# Patient Record
Sex: Female | Born: 1975 | Race: White | Hispanic: No | Smoking: Never smoker
Health system: Southern US, Community
[De-identification: ages and names within clinical notes are randomized; demographics above are authoritative.]

## PROBLEM LIST (undated history)

## (undated) DIAGNOSIS — F909 Attention-deficit hyperactivity disorder, unspecified type: Secondary | ICD-10-CM

## (undated) DIAGNOSIS — I4510 Unspecified right bundle-branch block: Secondary | ICD-10-CM

## (undated) DIAGNOSIS — E669 Obesity, unspecified: Secondary | ICD-10-CM

## (undated) DIAGNOSIS — F419 Anxiety disorder, unspecified: Secondary | ICD-10-CM

## (undated) DIAGNOSIS — B009 Herpesviral infection, unspecified: Secondary | ICD-10-CM

## (undated) DIAGNOSIS — F32A Depression, unspecified: Secondary | ICD-10-CM

## (undated) DIAGNOSIS — N200 Calculus of kidney: Secondary | ICD-10-CM

## (undated) DIAGNOSIS — I10 Essential (primary) hypertension: Secondary | ICD-10-CM

## (undated) DIAGNOSIS — R000 Tachycardia, unspecified: Secondary | ICD-10-CM

## (undated) DIAGNOSIS — R011 Cardiac murmur, unspecified: Secondary | ICD-10-CM

## (undated) DIAGNOSIS — R002 Palpitations: Secondary | ICD-10-CM

## (undated) DIAGNOSIS — F329 Major depressive disorder, single episode, unspecified: Secondary | ICD-10-CM

## (undated) DIAGNOSIS — R0602 Shortness of breath: Secondary | ICD-10-CM

## (undated) HISTORY — DX: Shortness of breath: R06.02

## (undated) HISTORY — DX: Major depressive disorder, single episode, unspecified: F32.9

## (undated) HISTORY — DX: Tachycardia, unspecified: R00.0

## (undated) HISTORY — DX: Obesity, unspecified: E66.9

## (undated) HISTORY — DX: Cardiac murmur, unspecified: R01.1

## (undated) HISTORY — DX: Unspecified right bundle-branch block: I45.10

## (undated) HISTORY — DX: Attention-deficit hyperactivity disorder, unspecified type: F90.9

## (undated) HISTORY — DX: Palpitations: R00.2

## (undated) HISTORY — PX: LAPAROSCOPIC CHOLECYSTECTOMY: SUR755

## (undated) HISTORY — DX: Depression, unspecified: F32.A

## (undated) HISTORY — DX: Essential (primary) hypertension: I10

## (undated) HISTORY — PX: MOUTH SURGERY: SHX715

## (undated) HISTORY — DX: Calculus of kidney: N20.0

## (undated) HISTORY — DX: Herpesviral infection, unspecified: B00.9

## (undated) HISTORY — DX: Anxiety disorder, unspecified: F41.9

---

## 1999-08-14 ENCOUNTER — Ambulatory Visit (HOSPITAL_COMMUNITY): Admission: RE | Admit: 1999-08-14 | Discharge: 1999-08-14 | Payer: Self-pay | Admitting: *Deleted

## 2000-01-08 ENCOUNTER — Other Ambulatory Visit: Admission: RE | Admit: 2000-01-08 | Discharge: 2000-01-08 | Payer: Self-pay | Admitting: Gynecology

## 2001-01-11 ENCOUNTER — Other Ambulatory Visit: Admission: RE | Admit: 2001-01-11 | Discharge: 2001-01-11 | Payer: Self-pay | Admitting: Gynecology

## 2001-01-21 HISTORY — PX: CERVICAL BIOPSY  W/ LOOP ELECTRODE EXCISION: SUR135

## 2001-02-10 ENCOUNTER — Other Ambulatory Visit: Admission: RE | Admit: 2001-02-10 | Discharge: 2001-02-10 | Payer: Self-pay | Admitting: Gynecology

## 2001-02-10 ENCOUNTER — Encounter (INDEPENDENT_AMBULATORY_CARE_PROVIDER_SITE_OTHER): Payer: Self-pay

## 2001-02-16 ENCOUNTER — Other Ambulatory Visit: Admission: RE | Admit: 2001-02-16 | Discharge: 2001-02-16 | Payer: Self-pay | Admitting: Gynecology

## 2001-02-16 ENCOUNTER — Encounter (INDEPENDENT_AMBULATORY_CARE_PROVIDER_SITE_OTHER): Payer: Self-pay

## 2001-06-02 ENCOUNTER — Other Ambulatory Visit: Admission: RE | Admit: 2001-06-02 | Discharge: 2001-06-02 | Payer: Self-pay | Admitting: Gynecology

## 2001-11-01 ENCOUNTER — Other Ambulatory Visit: Admission: RE | Admit: 2001-11-01 | Discharge: 2001-11-01 | Payer: Self-pay | Admitting: Gynecology

## 2002-01-30 ENCOUNTER — Encounter: Admission: RE | Admit: 2002-01-30 | Discharge: 2002-01-30 | Payer: Self-pay | Admitting: Gynecology

## 2002-04-16 ENCOUNTER — Inpatient Hospital Stay (HOSPITAL_COMMUNITY): Admission: AD | Admit: 2002-04-16 | Discharge: 2002-04-18 | Payer: Self-pay | Admitting: Gynecology

## 2002-04-16 ENCOUNTER — Encounter (INDEPENDENT_AMBULATORY_CARE_PROVIDER_SITE_OTHER): Payer: Self-pay | Admitting: Specialist

## 2002-06-04 ENCOUNTER — Other Ambulatory Visit: Admission: RE | Admit: 2002-06-04 | Discharge: 2002-06-04 | Payer: Self-pay | Admitting: Gynecology

## 2004-04-07 ENCOUNTER — Other Ambulatory Visit: Admission: RE | Admit: 2004-04-07 | Discharge: 2004-04-07 | Payer: Self-pay | Admitting: Gynecology

## 2004-04-14 ENCOUNTER — Emergency Department (HOSPITAL_COMMUNITY): Admission: EM | Admit: 2004-04-14 | Discharge: 2004-04-14 | Payer: Self-pay | Admitting: Emergency Medicine

## 2004-10-22 ENCOUNTER — Encounter (INDEPENDENT_AMBULATORY_CARE_PROVIDER_SITE_OTHER): Payer: Self-pay | Admitting: *Deleted

## 2004-10-22 ENCOUNTER — Inpatient Hospital Stay (HOSPITAL_COMMUNITY): Admission: AD | Admit: 2004-10-22 | Discharge: 2004-10-24 | Payer: Self-pay | Admitting: Gynecology

## 2004-12-03 ENCOUNTER — Other Ambulatory Visit: Admission: RE | Admit: 2004-12-03 | Discharge: 2004-12-03 | Payer: Self-pay | Admitting: Gynecology

## 2005-02-23 ENCOUNTER — Emergency Department (HOSPITAL_COMMUNITY): Admission: EM | Admit: 2005-02-23 | Discharge: 2005-02-23 | Payer: Self-pay | Admitting: Emergency Medicine

## 2006-08-31 ENCOUNTER — Other Ambulatory Visit: Admission: RE | Admit: 2006-08-31 | Discharge: 2006-08-31 | Payer: Self-pay | Admitting: Gynecology

## 2007-03-24 ENCOUNTER — Encounter: Admission: RE | Admit: 2007-03-24 | Discharge: 2007-03-24 | Payer: Self-pay | Admitting: Family Medicine

## 2007-04-01 ENCOUNTER — Emergency Department (HOSPITAL_COMMUNITY): Admission: EM | Admit: 2007-04-01 | Discharge: 2007-04-01 | Payer: Self-pay | Admitting: Emergency Medicine

## 2007-04-14 ENCOUNTER — Encounter: Admission: RE | Admit: 2007-04-14 | Discharge: 2007-04-14 | Payer: Self-pay | Admitting: Family Medicine

## 2007-05-26 ENCOUNTER — Ambulatory Visit (HOSPITAL_COMMUNITY): Admission: RE | Admit: 2007-05-26 | Discharge: 2007-05-27 | Payer: Self-pay | Admitting: *Deleted

## 2007-05-26 ENCOUNTER — Encounter (INDEPENDENT_AMBULATORY_CARE_PROVIDER_SITE_OTHER): Payer: Self-pay | Admitting: *Deleted

## 2007-09-06 ENCOUNTER — Other Ambulatory Visit: Admission: RE | Admit: 2007-09-06 | Discharge: 2007-09-06 | Payer: Self-pay | Admitting: Gynecology

## 2008-05-22 ENCOUNTER — Ambulatory Visit: Payer: Self-pay | Admitting: Gynecology

## 2008-07-17 ENCOUNTER — Encounter: Admission: RE | Admit: 2008-07-17 | Discharge: 2008-08-21 | Payer: Self-pay | Admitting: Family Medicine

## 2008-08-22 ENCOUNTER — Ambulatory Visit: Payer: Self-pay | Admitting: Gynecology

## 2008-08-26 ENCOUNTER — Encounter: Admission: RE | Admit: 2008-08-26 | Discharge: 2008-08-27 | Payer: Self-pay | Admitting: Family Medicine

## 2008-08-30 ENCOUNTER — Encounter: Admission: RE | Admit: 2008-08-30 | Discharge: 2008-08-30 | Payer: Self-pay | Admitting: Gynecology

## 2008-09-09 ENCOUNTER — Encounter: Payer: Self-pay | Admitting: Gynecology

## 2008-09-09 ENCOUNTER — Other Ambulatory Visit: Admission: RE | Admit: 2008-09-09 | Discharge: 2008-09-09 | Payer: Self-pay | Admitting: Gynecology

## 2008-09-09 ENCOUNTER — Ambulatory Visit: Payer: Self-pay | Admitting: Gynecology

## 2009-09-10 ENCOUNTER — Ambulatory Visit: Payer: Self-pay | Admitting: Gynecology

## 2009-09-10 ENCOUNTER — Other Ambulatory Visit: Admission: RE | Admit: 2009-09-10 | Discharge: 2009-09-10 | Payer: Self-pay | Admitting: Gynecology

## 2009-11-24 ENCOUNTER — Ambulatory Visit: Payer: Self-pay | Admitting: Gynecology

## 2010-09-21 ENCOUNTER — Other Ambulatory Visit: Payer: Self-pay | Admitting: Gynecology

## 2010-09-21 ENCOUNTER — Ambulatory Visit
Admission: RE | Admit: 2010-09-21 | Discharge: 2010-09-21 | Payer: Self-pay | Source: Home / Self Care | Attending: Gynecology | Admitting: Gynecology

## 2010-09-21 ENCOUNTER — Other Ambulatory Visit (HOSPITAL_COMMUNITY)
Admission: RE | Admit: 2010-09-21 | Discharge: 2010-09-21 | Disposition: A | Discharge status: Home / Self Care | Payer: Medicaid Other | Source: Ambulatory Visit | Attending: Gynecology | Admitting: Gynecology

## 2010-09-21 DIAGNOSIS — Z124 Encounter for screening for malignant neoplasm of cervix: Secondary | ICD-10-CM | POA: Insufficient documentation

## 2010-10-08 ENCOUNTER — Other Ambulatory Visit (INDEPENDENT_AMBULATORY_CARE_PROVIDER_SITE_OTHER): Payer: Medicaid Other

## 2010-10-08 DIAGNOSIS — R635 Abnormal weight gain: Secondary | ICD-10-CM

## 2010-10-08 DIAGNOSIS — R82998 Other abnormal findings in urine: Secondary | ICD-10-CM

## 2010-10-08 DIAGNOSIS — Z833 Family history of diabetes mellitus: Secondary | ICD-10-CM

## 2011-01-05 NOTE — Op Note (Signed)
NAMEANTIA, Megan Pace              ACCOUNT NO.:  [id number]   MEDICAL RECORD NO.:  [id number]          PATIENT TYPE:  OIB   LOCATION:  5727                         FACILITY:  [facility]   PHYSICIAN:  Alfonse Ras, MD   DATE OF BIRTH:  December 06, 1975   DATE OF PROCEDURE:  05/26/2007  DATE OF DISCHARGE:                               OPERATIVE REPORT   PREOPERATIVE DIAGNOSIS:  Biliary dyskinesia.   POSTOPERATIVE DIAGNOSIS:  Biliary dyskinesia.   FINDINGS:  Normal cholangiogram.   PROCEDURE:  Laparoscopic cholecystectomy with intraoperative  cholangiogram.   SURGEON:  Alfonse Ras, M.D.   ASSISTANT:  Currie Paris, M.D.   ANESTHESIA:  General endotracheal tube.   DESCRIPTION OF PROCEDURE:  The patient was taken to the operating room  and placed in a supine position.  After adequate general anesthesia was  induced using endotracheal tube, the abdomen was prepped and draped in  the normal sterile fashion.  Using a transverse infraumbilical incision,  I dissected down to the fascia.  Fascia was opened vertically.  An 0  Vicryl pursestring suture was placed around the fascial defect and a  Hasson trocar was placed in the abdomen.  Pneumoperitoneum was obtained.   Under direct vision, 11 mm trocar was placed in the subxiphoid position  and two 5-mm trocars were placed in the right abdomen.  The gallbladder  was identified and retracted cephalad.  There were few small filmy  adhesions, which were taken down with blunt dissection without  difficulty.  The neck of the gallbladder was identified and the cystic  duct with a critical view was dissected.  It was clamped with a clip  proximally and a small ductotomy was made.  Reddick catheter was placed  down through the cystic duct and cholangiogram was performed.  This  showed free flow into the duodenum, normal common bile duct, and normal  hepatic ducts.  The catheter was then removed and the cystic duct  distally was triply  clipped and divided.  Cystic artery was identified  in a similar fashion, dissected, triply clipped, and divided.  Gallbladder was taken off the gallbladder bed using Bovie electrocautery  and removed through the umbilical port without difficulty.  The right  upper quadrant was copiously irrigated.  Adequate hemostasis was  ensured.  Pneumoperitoneum was released.  The fascial defect was closed  with the 0 Vicryl  pursestring suture.  Skin incisions were closed with subcuticular 4-0  Monocryl.  All incisions were injected using 0.5 Marcaine.  Steri-Strips  and sterile dressings were applied.  The patient tolerated the procedure  well, went to PACU in good condition.      Alfonse Ras, MD  Electronically Signed     KRE/MEDQ  D:  05/26/2007  T:  05/26/2007  Job:  [id number]

## 2011-01-08 NOTE — Discharge Summary (Signed)
   NAME:  Megan Pace, Megan Pace                        ACCOUNT NO.:  [id number]   MEDICAL RECORD NO.:  [id number]                   PATIENT TYPE:  INP   LOCATION:  9135                                 FACILITY:  [facility]   PHYSICIAN:  Juan H. Lily Peer, M.D.             DATE OF BIRTH:  1975/12/18   DATE OF ADMISSION:  04/16/2002  DATE OF DISCHARGE:  04/18/2002                                 DISCHARGE SUMMARY   DISCHARGE DIAGNOSES:  1. Intrauterine pregnancy 36-1/2 weeks.  2. Spontaneous onset of labor.   PROCEDURE:  Vacuum assisted vaginal delivery of viable infant over midline  episiotomy with third degree extension.   HISTORY OF PRESENT ILLNESS:  The patient is a 35 year old gravida 2, para 0-  0-1-0, LMP July 24, 2001, [facility] May 10, 2002 per ultrasound.  Prenatal course was complicated by gestational diabetes which was diet  controlled.   LABORATORIES:  Blood type O+.  Antibody screen negative.  RPR, HBSAG, HIV  nonreactive.   HOSPITAL COURSE:  The patient presented at 36-1/2 weeks with spontaneous  onset of labor.  She did progress to complete dilatation.  Delivery was  accomplished per vacuum assistance viable female infant, Apgars 8/9, weight  5 pounds 13 ounces.  Postpartum course patient remained afebrile.  Had no  difficulty voiding.  Was able to be discharged in satisfactory condition on  her second postpartum day.  CBC:  Hematocrit 33.1, hemoglobin 11.4, WBC  14.9, platelets 263,000.   DISPOSITION:  Follow up in six weeks.  Continue with prenatal vitamins and  iron.  Motrin for pain.     Elwyn Lade . Hancock, N.P.                Gaetano Hawthorne. Lily Peer, M.D.    MKH/MEDQ  D:  05/21/2002  T:  05/21/2002  Job:  [id number]

## 2011-01-08 NOTE — H&P (Signed)
Megan Pace, Megan Pace              ACCOUNT NO.:  [id number]   MEDICAL RECORD NO.:  [id number]          PATIENT TYPE:  MAT   LOCATION:  MATC                          FACILITY:  [facility]   PHYSICIAN:  Juan H. Lily Peer, M.D.DATE OF BIRTH:  1975-10-07   DATE OF ADMISSION:  10/22/2004  DATE OF DISCHARGE:                                HISTORY & PHYSICAL   CHIEF COMPLAINT:  Term intrauterine pregnancy, favorable cervix.   HISTORY:  The patient is a 35 year old gravida 3, para 1, with corrected  estimated date of confinement October 23, 2004.  Will be 39-6/7ths weeks'  gestation when she is admitted at [facility] on the morning of October 22, 2004.  The patient was seen in the office on October 19, 2004 and  respectively on October 21, 2004.  Her cervix was 2-cm, 80% effaced, -2  station, and is being admitted for Pitocin IV induction.  Her last child had  weighed 6 pounds and delivered at 37 weeks with a nine hour labor.  The  patient's prenatal course, she had Trichomoniasis and BV and was treated.  She has had a history of LEEP, cervical conization back in 2002.  She had  gestational diabetes in her last pregnancy but passed a diabetes screen with  this pregnancy.   PAST MEDICAL HISTORY:  1.  The patient denies any allergies.  2.  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  Margins had been free.  3.  She has had one normal spontaneous vaginal delivery.   She had been taking Prozac in the past for anxiety and depression, had not  been taking it during the pregnancy.   Mother and grandfather with history of diabetes.   REVIEW OF SYSTEMS:  See hospital form.   PHYSICAL EXAMINATION:  VITAL SIGNS:  Blood pressure today 118/76, urine  trace protein negative glucose, weight 175.5 pounds, fetal heart rate 150  beats per minute.  HEENT:  Unremarkable.  NECK:  Supple.  Trachea midline.  No carotid bruits.  No thyromegaly.  LUNGS:  Clear to auscultation without rhonchi or wheezes.  HEART:   Regular rate and rhythm.  No murmurs or gallops.  BREAST:  Not done.  ABDOMEN:  Gravid uterus and fundal height 38-cm.  Vertex presentation by  [facility] maneuver.  PELVIC:  Cervix 1 - to -2-cm, 80% effaced, -2 station.  EXTREMITIES:  DTRs 1+.  Trace edema.   PRENATAL LABS:  The patient with O positive blood type.  Negative antibody  screen.  VDRL was nonreactive.  Rubella immune.  Hepatitis B surface antigen  and HIV were negative.  Diabetes screen was normal.  Her GBS culture was  negative and her Pap smear was normal.   ASSESSMENT:  1.  A 35 year old gravida 3, para 1, abortion 1, at 39-6/7ths weeks'      gestation with advanced cervical dilatation.  2.  Group B strep negative.   The patient will be admitted on the morning of October 22, 2004 for a Pitocin  IV.  Her cervix was 2-cm, 80% effaced, -2 station, vertex presentation on  office visit  on October 21, 2004.  Her GBS culture was negative.  Induction was  discussed with the patient, all questions were answered, and will follow  accordingly.   PLAN:  As per assessment above.      JHF/MEDQ  D:  10/21/2004  T:  10/21/2004  Job:  [id number]

## 2011-03-30 ENCOUNTER — Other Ambulatory Visit: Payer: Self-pay | Admitting: *Deleted

## 2011-03-30 MED ORDER — ALPRAZOLAM 0.5 MG PO TABS: 0.5000 mg | ORAL_TABLET | ORAL | Status: AC

## 2011-03-30 NOTE — Telephone Encounter (Signed)
PHARMACY FAXED OVER RX REFILL.  IF REFILLS PLEASE FILL OUT AND ROUTE BACK. I WILL CALL RX IN.

## 2011-03-31 NOTE — Telephone Encounter (Signed)
RX FAXED TO [facility] XANAX 0.5MG  TAB #30 6 REFILLS

## 2011-06-03 LAB — DIFFERENTIAL
Basophils Absolute: 0
Eosinophils Absolute: 0.2
Eosinophils Relative: 3
Monocytes Absolute: 0.5

## 2011-06-03 LAB — CBC
HCT: 38.9
MCHC: 33.9
RBC: 4.55

## 2011-06-07 LAB — COMPREHENSIVE METABOLIC PANEL
Alkaline Phosphatase: 77
CO2: 27
Calcium: 9.1
Chloride: 106
GFR calc Af Amer: >60
GFR calc non Af Amer: >60
Glucose, Bld: 123 — ABNORMAL HIGH

## 2011-06-07 LAB — DIFFERENTIAL
Basophils Absolute: 0
Eosinophils Relative: 7 — ABNORMAL HIGH
Lymphocytes Relative: 23
Monocytes Absolute: 0.6
Monocytes Relative: 6
Neutrophils Relative %: 65

## 2011-06-07 LAB — URINALYSIS, ROUTINE W REFLEX MICROSCOPIC
Bilirubin Urine: NEGATIVE
Glucose, UA: NEGATIVE
Hgb urine dipstick: NEGATIVE
Protein, ur: NEGATIVE
Specific Gravity, Urine: 1.038 — ABNORMAL HIGH

## 2011-06-07 LAB — CBC
MCHC: 34.5
Platelets: 364
RBC: 4.78
RDW: 13.5

## 2011-06-07 LAB — LIPASE, BLOOD: Lipase: 16

## 2011-06-07 LAB — POCT PREGNANCY, URINE
Operator id: 192351
Preg Test, Ur: NEGATIVE

## 2011-10-06 ENCOUNTER — Ambulatory Visit (INDEPENDENT_AMBULATORY_CARE_PROVIDER_SITE_OTHER): Payer: Medicaid Other | Admitting: Gynecology

## 2011-10-06 ENCOUNTER — Encounter: Payer: Medicaid Other | Admitting: Gynecology

## 2011-10-06 ENCOUNTER — Encounter: Payer: Self-pay | Admitting: Gynecology

## 2011-10-06 ENCOUNTER — Other Ambulatory Visit (HOSPITAL_COMMUNITY)
Admission: RE | Admit: 2011-10-06 | Discharge: 2011-10-06 | Disposition: A | Discharge status: Home / Self Care | Payer: Medicaid Other | Source: Ambulatory Visit | Attending: Gynecology | Admitting: Gynecology

## 2011-10-06 VITALS — BP 124/78 | Ht 62.75 in | Wt 179.0 lb

## 2011-10-06 DIAGNOSIS — Z0000 Encounter for general adult medical examination without abnormal findings: Secondary | ICD-10-CM

## 2011-10-06 DIAGNOSIS — Z833 Family history of diabetes mellitus: Secondary | ICD-10-CM | POA: Insufficient documentation

## 2011-10-06 DIAGNOSIS — Z01419 Encounter for gynecological examination (general) (routine) without abnormal findings: Secondary | ICD-10-CM

## 2011-10-06 DIAGNOSIS — Z8632 Personal history of gestational diabetes: Secondary | ICD-10-CM

## 2011-10-06 DIAGNOSIS — R634 Abnormal weight loss: Secondary | ICD-10-CM

## 2011-10-06 LAB — URINALYSIS W MICROSCOPIC + REFLEX CULTURE
Bilirubin Urine: NEGATIVE
Ketones, ur: NEGATIVE mg/dL
Leukocytes, UA: NEGATIVE
Nitrite: NEGATIVE
Urobilinogen, UA: 0.2 mg/dL (ref 0.0–1.0)
pH: 6.5 (ref 5.0–8.0)

## 2011-10-06 NOTE — Progress Notes (Signed)
Megan Pace 04/24/1976 [id number]   History:    36 y.o.  for annual exam with concern about her IUD string been filled by her partner. Review of patient's records indicated that she had a LEEP cervical conization in 2002 and margins were clear. Subsequent Pap smears have been normal. She had a Mirena IUD placed in April 2011 and has done well otherwise. Patient has a history of herpes but no recent outbreaks reported. For depression and anxiety Dr. Kizzie Bane has been following her period patient with strong family history diabetes as well as herself having had gestational diabetes in the past.  Past medical history,surgical history, family history and social history were all reviewed and documented in the EPIC chart.  Gynecologic History No LMP recorded. Patient is not currently having periods (Reason: IUD). Contraception: IUD Last Pap: January 2012. Results were: normal Last mammogram: H. 35 baseline. Results were: normal  Obstetric History OB History    Grav Para Term Preterm Abortions TAB SAB Ect Mult Living   4 2 2  1  1   2      # Outc Date GA Lbr Len/2nd Wgt Sex Del Anes PTL Lv   1 TRM     F SVD  No Yes   2 TRM     F SVD  No Yes   3 GRA            4 SAB                ROS:  Was performed and pertinent positives and negatives are included in the history.  Exam: chaperone present  BP 124/78  Ht 5' 2.75" (1.594 m)  Wt 179 lb (81.194 kg)  BMI 31.96 kg/m2  Body mass index is 31.96 kg/(m^2).  General appearance : Well developed well nourished female. No acute distress HEENT: Neck supple, trachea midline, no carotid bruits, no thyroidmegaly Lungs: Clear to auscultation, no rhonchi or wheezes, or rib retractions  Heart: Regular rate and rhythm, no murmurs or gallops Breast:Examined in sitting and supine position were symmetrical in appearance, no palpable masses or tenderness,  no skin retraction, no nipple inversion, no nipple discharge, no skin discoloration, no axillary or  supraclavicular lymphadenopathy Abdomen: no palpable masses or tenderness, no rebound or guarding Extremities: no edema or skin discoloration or tenderness  Pelvic:  Bartholin, Urethra, Skene Glands: Within normal limits             Vagina: No gross lesions or discharge  Cervix: No gross lesions or discharge, IUD string not visualized  Uterus  anteverted, normal size, shape and consistency, non-tender and mobile  Adnexa  Without masses or tenderness  Anus and perineum  normal   Rectovaginal  normal sphincter tone without palpated masses or tenderness             Hemoccult not done     Assessment/Plan:  36 y.o. female for annual exam will return back next week for an ultrasound to confirm that the IUD still in place. The cervix was slightly friable which was treated with silver nitrate. There was no evidence of any vaginal discharge. Patient been exercising indicting to try to lose weight. Review of her record again she was weighing 185 last years down to 179. Literature information on exercise and diet provided. 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 Pap smear was done today if negative we'll proceed with a Pap smear every 3 years. The following labs were drawn  CBC, cholesterol, TSH, random blood sugar, and urinalysis.    Ok Edwards MD, 4:30 PM 10/06/2011

## 2011-10-06 NOTE — Patient Instructions (Signed)
Exercise to Lose Weight Exercise and a healthy diet may help you lose weight. Your doctor may suggest specific exercises. EXERCISE IDEAS AND TIPS  Choose low-cost things you enjoy doing, such as walking, bicycling, or exercising to workout videos.   Take stairs instead of the elevator.   Walk during your lunch break.   Park your car further away from work or school.   Go to a gym or an exercise class.   Start with 5 to 10 minutes of exercise each day. Build up to 30 minutes of exercise 4 to 6 days a week.   Wear shoes with good support and comfortable clothes.   Stretch before and after working out.   Work out until you breathe harder and your heart beats faster.   Drink extra water when you exercise.   Do not do so much that you hurt yourself, feel dizzy, or get very short of breath.  Exercises that burn about 150 calories:  Running 1  miles in 15 minutes.   Playing volleyball for 45 to 60 minutes.   Washing and waxing a car for 45 to 60 minutes.   Playing touch football for 45 minutes.   Walking 1  miles in 35 minutes.   Pushing a stroller 1  miles in 30 minutes.   Playing basketball for 30 minutes.   Raking leaves for 30 minutes.   Bicycling 5 miles in 30 minutes.   Walking 2 miles in 30 minutes.   Dancing for 30 minutes.   Shoveling snow for 15 minutes.   Swimming laps for 20 minutes.   Walking up stairs for 15 minutes.   Bicycling 4 miles in 15 minutes.   Gardening for 30 to 45 minutes.   Jumping rope for 15 minutes.   Washing windows or floors for 45 to 60 minutes.  Document Released: 09/11/2010 Document Revised: 04/21/2011 Document Reviewed: 09/11/2010 [employer] Patient Information 2012 [employer].                                                  Cholesterol Control Diet  Cholesterol levels in your body are determined significantly by your diet. Cholesterol levels may also be related to heart disease. The following material helps to  explain this relationship and discusses what you can do to help keep your heart healthy. Not all cholesterol is bad. Low-density lipoprotein (LDL) cholesterol is the "bad" cholesterol. It may cause fatty deposits to build up inside your arteries. High-density lipoprotein (HDL) cholesterol is "good." It helps to remove the "bad" LDL cholesterol from your blood. Cholesterol is a very important risk factor for heart disease. Other risk factors are high blood pressure, smoking, stress, heredity, and weight. The heart muscle gets its supply of blood through the coronary arteries. If your LDL cholesterol is high and your HDL cholesterol is low, you are at risk for having fatty deposits build up in your coronary arteries. This leaves less room through which blood can flow. Without sufficient blood and oxygen, the heart muscle cannot function properly and you may feel chest pains (angina pectoris). When a coronary artery closes up entirely, a part of the heart muscle may die, causing a heart attack (myocardial infarction). CHECKING CHOLESTEROL When your caregiver sends your blood to a lab to be analyzed for cholesterol, a complete lipid (fat) profile may be done. With   this test, the total amount of cholesterol and levels of LDL and HDL are determined. Triglycerides are a type of fat that circulates in the blood and can also be used to determine heart disease risk. The list below describes what the numbers should be: Test: Total Cholesterol.  Less than 200 mg/dl.  Test: LDL "bad cholesterol."  Less than 100 mg/dl.   Less than 70 mg/dl if you are at very high risk of a heart attack or sudden cardiac death.  Test: HDL "good cholesterol."  Greater than 50 mg/dl for women.   Greater than 40 mg/dl for men.  Test: Triglycerides.  Less than 150 mg/dl.  CONTROLLING CHOLESTEROL WITH DIET Although exercise and lifestyle factors are important, your diet is key. That is because certain foods are known to raise  cholesterol and others to lower it. The goal is to balance foods for their effect on cholesterol and more importantly, to replace saturated and trans fat with other types of fat, such as monounsaturated fat, polyunsaturated fat, and omega-3 fatty acids. On average, a person should consume no more than 15 to 17 g of saturated fat daily. Saturated and trans fats are considered "bad" fats, and they will raise LDL cholesterol. Saturated fats are primarily found in animal products such as meats, butter, and cream. However, that does not mean you need to sacrifice all your favorite foods. Today, there are good tasting, low-fat, low-cholesterol substitutes for most of the things you like to eat. Choose low-fat or nonfat alternatives. Choose round or loin cuts of red meat, since these types of cuts are lowest in fat and cholesterol. Chicken (without the skin), fish, veal, and ground turkey breast are excellent choices. Eliminate fatty meats, such as hot dogs and salami. Even shellfish have little or no saturated fat. Have a 3 oz (85 g) portion when you eat lean meat, poultry, or fish. Trans fats are also called "partially hydrogenated oils." They are oils that have been scientifically manipulated so that they are solid at room temperature resulting in a longer shelf life and improved taste and texture of foods in which they are added. Trans fats are found in stick margarine, some tub margarines, cookies, crackers, and baked goods.  When baking and cooking, oils are an excellent substitute for butter. The monounsaturated oils are especially beneficial since it is believed they lower LDL and raise HDL. The oils you should avoid entirely are saturated tropical oils, such as coconut and palm.  Remember to eat liberally from food groups that are naturally free of saturated and trans fat, including fish, fruit, vegetables, beans, grains (barley, rice, couscous, bulgur wheat), and pasta (without cream sauces).  IDENTIFYING  FOODS THAT LOWER CHOLESTEROL  Soluble fiber may lower your cholesterol. This type of fiber is found in fruits such as apples, vegetables such as broccoli, potatoes, and carrots, legumes such as beans, peas, and lentils, and grains such as barley. Foods fortified with plant sterols (phytosterol) may also lower cholesterol. You should eat at least 2 g per day of these foods for a cholesterol lowering effect.  Read package labels to identify low-saturated fats, trans fats free, and low-fat foods at the supermarket. Select cheeses that have only 2 to 3 g saturated fat per ounce. Use a heart-healthy tub margarine that is free of trans fats or partially hydrogenated oil. When buying baked goods (cookies, crackers), avoid partially hydrogenated oils. Breads and muffins should be made from whole grains (whole-wheat or whole oat flour, instead of "flour" or "  enriched flour"). Buy non-creamy canned soups with reduced salt and no added fats.  FOOD PREPARATION TECHNIQUES  Never deep-fry. If you must fry, either stir-fry, which uses very little fat, or use non-stick cooking sprays. When possible, broil, bake, or roast meats, and steam vegetables. Instead of dressing vegetables with butter or margarine, use lemon and herbs, applesauce and cinnamon (for squash and sweet potatoes), nonfat yogurt, salsa, and low-fat dressings for salads.  LOW-SATURATED FAT / LOW-FAT FOOD SUBSTITUTES Meats / Saturated Fat (g)  Avoid: Steak, marbled (3 oz/85 g) / 11 g   Choose: Steak, lean (3 oz/85 g) / 4 g   Avoid: Hamburger (3 oz/85 g) / 7 g   Choose: Hamburger, lean (3 oz/85 g) / 5 g   Avoid: Ham (3 oz/85 g) / 6 g   Choose: Ham, lean cut (3 oz/85 g) / 2.4 g   Avoid: Chicken, with skin, dark meat (3 oz/85 g) / 4 g   Choose: Chicken, skin removed, dark meat (3 oz/85 g) / 2 g   Avoid: Chicken, with skin, light meat (3 oz/85 g) / 2.5 g   Choose: Chicken, skin removed, light meat (3 oz/85 g) / 1 g  Dairy / Saturated Fat  (g)  Avoid: Whole milk (1 cup) / 5 g   Choose: Low-fat milk, 2% (1 cup) / 3 g   Choose: Low-fat milk, 1% (1 cup) / 1.5 g   Choose: Skim milk (1 cup) / 0.3 g   Avoid: Hard cheese (1 oz/28 g) / 6 g   Choose: Skim milk cheese (1 oz/28 g) / 2 to 3 g   Avoid: Cottage cheese, 4% fat (1 cup) / 6.5 g   Choose: Low-fat cottage cheese, 1% fat (1 cup) / 1.5 g   Avoid: Ice cream (1 cup) / 9 g   Choose: Sherbet (1 cup) / 2.5 g   Choose: Nonfat frozen yogurt (1 cup) / 0.3 g   Choose: Frozen fruit bar / trace   Avoid: Whipped cream (1 tbs) / 3.5 g   Choose: Nondairy whipped topping (1 tbs) / 1 g  Condiments / Saturated Fat (g)  Avoid: Mayonnaise (1 tbs) / 2 g   Choose: Low-fat mayonnaise (1 tbs) / 1 g   Avoid: Butter (1 tbs) / 7 g   Choose: Extra light margarine (1 tbs) / 1 g   Avoid: Coconut oil (1 tbs) / 11.8 g   Choose: Olive oil (1 tbs) / 1.8 g   Choose: Corn oil (1 tbs) / 1.7 g   Choose: Safflower oil (1 tbs) / 1.2 g   Choose: Sunflower oil (1 tbs) / 1.4 g   Choose: Soybean oil (1 tbs) / 2.4 g   Choose: Canola oil (1 tbs) / 1 g  Document Released: 08/09/2005 Document Revised: 04/21/2011 Document Reviewed: 01/28/2011 [employer] Patient Information 2012 [employer].   

## 2011-10-06 NOTE — Progress Notes (Signed)
Addended by: Bertram Savin A on: 10/06/2011 04:59 PM   Modules accepted: Orders

## 2011-10-07 LAB — CBC WITH DIFFERENTIAL/PLATELET
Basophils Relative: 0 % (ref 0–1)
Eosinophils Absolute: 0.1 10*3/uL (ref 0.0–0.7)
Eosinophils Relative: 1 % (ref 0–5)
Hemoglobin: 13.9 g/dL (ref 12.0–15.0)
MCH: 28.8 pg (ref 26.0–34.0)
Monocytes Relative: 7 % (ref 3–12)
Neutro Abs: 6.9 10*3/uL (ref 1.7–7.7)
Neutrophils Relative %: 59 % (ref 43–77)
Platelets: 402 10*3/uL — ABNORMAL HIGH (ref 150–400)
WBC: 11.7 10*3/uL — ABNORMAL HIGH (ref 4.0–10.5)

## 2011-10-07 LAB — TSH: TSH: 1.434 u[IU]/mL (ref 0.350–4.500)

## 2011-10-07 LAB — GLUCOSE, RANDOM: Glucose, Bld: 119 mg/dL — ABNORMAL HIGH (ref 70–99)

## 2011-10-07 NOTE — Progress Notes (Signed)
Error entry

## 2011-10-08 ENCOUNTER — Other Ambulatory Visit: Payer: Self-pay | Admitting: *Deleted

## 2011-10-11 ENCOUNTER — Ambulatory Visit (INDEPENDENT_AMBULATORY_CARE_PROVIDER_SITE_OTHER): Payer: Medicaid Other | Admitting: Gynecology

## 2011-10-11 ENCOUNTER — Encounter: Payer: Self-pay | Admitting: Gynecology

## 2011-10-11 ENCOUNTER — Ambulatory Visit (INDEPENDENT_AMBULATORY_CARE_PROVIDER_SITE_OTHER): Payer: Medicaid Other

## 2011-10-11 VITALS — BP 126/84

## 2011-10-11 DIAGNOSIS — N930 Postcoital and contact bleeding: Secondary | ICD-10-CM

## 2011-10-11 DIAGNOSIS — N831 Corpus luteum cyst of ovary, unspecified side: Secondary | ICD-10-CM

## 2011-10-11 DIAGNOSIS — N83209 Unspecified ovarian cyst, unspecified side: Secondary | ICD-10-CM

## 2011-10-11 NOTE — Patient Instructions (Signed)
Follow up ultrasound three months

## 2011-10-11 NOTE — Progress Notes (Signed)
Patient presented to the office today for an ultrasound due to the fact that time of her annual exam on favor the 13th the IUD string was not seen. Ultrasound today as follows:  Uterus measures 7.9 x 4.5 x 3.3 cm endometrial stripe 6 mm. IUD was seen in normal position. Left ovary was normal. Right ovary thickwalled cyst with reticular echo pattern measuring 30 x 23 x 29 mm average size 27 mm with positive color flow in the periphery. Arterial blood flow seen in the right ovary. No fluid in the cul-de-sac.  Recently an office visit her hemoglobin was found to be 11.7 she been instructed to start taking iron 1 tablet daily. Also a random blood sugar was 119 and patient was to return later in the week for fasting blood sugar. Her recent Pap smear was normal as well.  Patient to return back in 3 months for followup ultrasound. Incidental finding of this small cyst on the right ovary appears to be a hemorrhagic as/corpus luteum cyst. Patient is asymptomatic otherwise.

## 2011-10-13 ENCOUNTER — Other Ambulatory Visit: Payer: Medicaid Other

## 2012-03-01 ENCOUNTER — Encounter: Payer: Self-pay | Admitting: Gynecology

## 2012-03-01 ENCOUNTER — Ambulatory Visit (INDEPENDENT_AMBULATORY_CARE_PROVIDER_SITE_OTHER): Payer: Medicaid Other

## 2012-03-01 ENCOUNTER — Ambulatory Visit (INDEPENDENT_AMBULATORY_CARE_PROVIDER_SITE_OTHER): Payer: Medicaid Other | Admitting: Gynecology

## 2012-03-01 VITALS — BP 122/70

## 2012-03-01 DIAGNOSIS — IMO0001 Reserved for inherently not codable concepts without codable children: Secondary | ICD-10-CM

## 2012-03-01 DIAGNOSIS — N831 Corpus luteum cyst of ovary, unspecified side: Secondary | ICD-10-CM

## 2012-03-01 DIAGNOSIS — Z975 Presence of (intrauterine) contraceptive device: Secondary | ICD-10-CM

## 2012-03-01 DIAGNOSIS — N83209 Unspecified ovarian cyst, unspecified side: Secondary | ICD-10-CM

## 2012-03-01 NOTE — Progress Notes (Signed)
Patient presented to the office today for an ultrasound followup ovarian cyst. Patient had been seen the office in February 2013 for her annual exam the IUD string was not seen. An ultrasound was done which demonstrated the following:  Uterus measures 7.9 x 4.5 x 3.3 cm endometrial stripe 6 mm. IUD was seen in normal position. Left ovary was normal. Right ovary thickwalled cyst with reticular echo pattern measuring 30 x 23 x 29 mm average size 27 mm with positive color flow in the periphery. Arterial blood flow seen in the right ovary. No fluid in the cul-de-sac.  She returned today for followup ultrasound she is otherwise asymptomatic. The ultrasound demonstrated uterus measures 7.5 x 5.2 x 2.8 cm with endometrial stripe of 6.2 mm. Uterus is anteverted and IUD was seen in the normal position. A right corpus luteum cyst was noted on the right measuring 22 x 17 x 15 mm with arterial blood flow in the periphery left R. was normal cul-de-sac was negative.  Patient was reassured the findings discussed she's otherwise scheduled to return back next year or when necessary.

## 2012-07-07 ENCOUNTER — Encounter (HOSPITAL_BASED_OUTPATIENT_CLINIC_OR_DEPARTMENT_OTHER): Payer: Self-pay

## 2012-07-07 ENCOUNTER — Emergency Department (HOSPITAL_BASED_OUTPATIENT_CLINIC_OR_DEPARTMENT_OTHER): Payer: Medicaid Other

## 2012-07-07 ENCOUNTER — Emergency Department (HOSPITAL_BASED_OUTPATIENT_CLINIC_OR_DEPARTMENT_OTHER)
Admission: EM | Admit: 2012-07-07 | Discharge: 2012-07-07 | Disposition: A | Discharge status: Home / Self Care | Payer: Medicaid Other | Attending: Emergency Medicine | Admitting: Emergency Medicine

## 2012-07-07 DIAGNOSIS — Z79899 Other long term (current) drug therapy: Secondary | ICD-10-CM | POA: Insufficient documentation

## 2012-07-07 DIAGNOSIS — F329 Major depressive disorder, single episode, unspecified: Secondary | ICD-10-CM | POA: Insufficient documentation

## 2012-07-07 DIAGNOSIS — R002 Palpitations: Secondary | ICD-10-CM | POA: Insufficient documentation

## 2012-07-07 DIAGNOSIS — F411 Generalized anxiety disorder: Secondary | ICD-10-CM | POA: Insufficient documentation

## 2012-07-07 DIAGNOSIS — B009 Herpesviral infection, unspecified: Secondary | ICD-10-CM | POA: Insufficient documentation

## 2012-07-07 DIAGNOSIS — F3289 Other specified depressive episodes: Secondary | ICD-10-CM | POA: Insufficient documentation

## 2012-07-07 DIAGNOSIS — I10 Essential (primary) hypertension: Secondary | ICD-10-CM | POA: Insufficient documentation

## 2012-07-07 DIAGNOSIS — E119 Type 2 diabetes mellitus without complications: Secondary | ICD-10-CM | POA: Insufficient documentation

## 2012-07-07 DIAGNOSIS — R200 Anesthesia of skin: Secondary | ICD-10-CM

## 2012-07-07 DIAGNOSIS — F909 Attention-deficit hyperactivity disorder, unspecified type: Secondary | ICD-10-CM | POA: Insufficient documentation

## 2012-07-07 DIAGNOSIS — R209 Unspecified disturbances of skin sensation: Secondary | ICD-10-CM | POA: Insufficient documentation

## 2012-07-07 LAB — TROPONIN I: Troponin I: <0.3 ng/mL (ref <0.30)

## 2012-07-07 LAB — CBC WITH DIFFERENTIAL/PLATELET
Basophils Absolute: 0 10*3/uL (ref 0.0–0.1)
Basophils Relative: 0 % (ref 0–1)
HCT: 39.3 % (ref 36.0–46.0)
Hemoglobin: 13.9 g/dL (ref 12.0–15.0)
Lymphocytes Relative: 38 % (ref 12–46)
Monocytes Absolute: 0.5 10*3/uL (ref 0.1–1.0)
Monocytes Relative: 6 % (ref 3–12)
Neutro Abs: 4.3 10*3/uL (ref 1.7–7.7)
Neutrophils Relative %: 54 % (ref 43–77)
RDW: 12.4 % (ref 11.5–15.5)
WBC: 8 10*3/uL (ref 4.0–10.5)

## 2012-07-07 LAB — BASIC METABOLIC PANEL
CO2: 26 mEq/L (ref 19–32)
Chloride: 102 mEq/L (ref 96–112)
Creatinine, Ser: 0.8 mg/dL (ref 0.50–1.10)
GFR calc Af Amer: >90 mL/min (ref >90)
Potassium: 3.5 mEq/L (ref 3.5–5.1)

## 2012-07-07 NOTE — ED Notes (Signed)
Pt now states she had "funny feeling in my chest this morning"

## 2012-07-07 NOTE — ED Notes (Signed)
C/o left arm numbness intermittent for "few weeks"-worse since 930am-also c/o elevated BP "over a year"-no dx of HTN/no meds

## 2012-07-07 NOTE — ED Notes (Signed)
Pt currently reports numbness to her right hand and cont'd feeling like her speech "is not right" with mother at [facility] in agreement-pt speech is clear-with cont'd neuro intact-EDPA notified of pt's concerns

## 2012-07-07 NOTE — ED Notes (Signed)
Pt c/o left side facial numbness and mouth dryness and feeling like her speech "just isn't connecting" in the last 30 min-pt A/O-no disturbance in speech noted-cont'd neuro intact-VSS-EDPA notified of pt concerns

## 2012-07-07 NOTE — ED Provider Notes (Signed)
History     CSN: [id number]  Arrival date & time 07/07/12  1209   First MD Initiated Contact with Patient 07/07/12 1228      Chief Complaint  Patient presents with  . Numbness    (Consider location/radiation/quality/duration/timing/severity/associated sxs/prior treatment) HPI Comments: Patient is a 36 year old female with a past medical history of hypertension who presents with a "few week" history of intermittent left arm numbness/tingling. This morning, about 3 hours ago, she reports having an episode of left arm numbness/tingling while at rest with associated left neck numbness/tingling and a "chest sensation" in her left chest that she is unable to characterize. The symptoms lasted a few hours before spontaneously resolving without intervention. Patient has never experiences this before. She has no family history of CAD, MI or stroke and she is not a smoker. No alleviating/aggravating factors. Patient denies headache, visual changes, dysarthria, neck pain, back pain, chest pain, SOB, diaphoresis, dizziness, NVD, abdominal pain.    Past Medical History  Diagnosis Date  . NSVD (normal spontaneous vaginal delivery)   . Diabetes mellitus     GESTATIONAL  . HSV-2 (herpes simplex virus 2) infection   . Depression   . Anxiety   . ADD (attention deficit disorder with hyperactivity)     Past Surgical History  Procedure Date  . Cervical biopsy  w/ loop electrode excision 01/2001    MARGINS FREE CIN 2  . Laparoscopic cholecystectomy     Family History  Problem Relation Age of Onset  . Diabetes Mother     SKIN   . Cancer Mother   . Hypertension Father   . Hypertension Paternal Grandmother     History  Substance Use Topics  . Smoking status: Never Smoker   . Smokeless tobacco: Never Used  . Alcohol Use: No    OB History    Grav Para Term Preterm Abortions TAB SAB Ect Mult Living   4 2 2  1  1   2       Review of Systems  Cardiovascular: Positive for palpitations.    Neurological: Positive for numbness.  All other systems reviewed and are negative.    Allergies  Review of patient's allergies indicates no known allergies.  Home Medications   Current Outpatient Rx  Name  Route  Sig  Dispense  Refill  . BUSPIRONE HCL 10 MG PO TABS   Oral   Take 10 mg by mouth 2 (two) times daily.         Marland Kitchen CLOMIPRAMINE HCL 25 MG PO CAPS   Oral   Take 25 mg by mouth at bedtime.         Marland Kitchen FLUVOXAMINE MALEATE ER 100 MG PO CP24   Oral   Take by mouth at bedtime.         . ALPRAZOLAM 0.5 MG PO TABS   Oral   Take 0.5 mg by mouth at bedtime as needed.         . AMPHETAMINE-DEXTROAMPHET ER 25 MG PO CP24   Oral   Take 25 mg by mouth every morning.         Marland Kitchen BUPROPION HCL 100 MG PO TABS   Oral   Take 100 mg by mouth 2 (two) times daily.         Marland Kitchen CITALOPRAM HYDROBROMIDE 10 MG PO TABS   Oral   Take 10 mg by mouth daily.         Marland Kitchen ONE-DAILY MULTI VITAMINS PO TABS  Oral   Take 1 tablet by mouth daily.           BP 148/102  Pulse 98  Temp 98.3 F (36.8 C) (Oral)  Resp 16  Ht 5\' 2"  (1.575 m)  Wt 170 lb (77.111 kg)  BMI 31.09 kg/m2  SpO2 98%  Physical Exam  Nursing note and vitals reviewed. Constitutional: She is oriented to person, place, and time. She appears well-developed and well-nourished. No distress.  HENT:  Head: Normocephalic and atraumatic.  Right Ear: External ear normal.  Left Ear: External ear normal.  Nose: Nose normal.  Mouth/Throat: Oropharynx is clear and moist. No oropharyngeal exudate.  Eyes: Conjunctivae normal and EOM are normal. Pupils are equal, round, and reactive to light. No scleral icterus.  Neck: Normal range of motion. Neck supple.  Cardiovascular: Normal rate and regular rhythm.  Exam reveals no gallop and no friction rub.   No murmur heard. Pulmonary/Chest: Effort normal and breath sounds normal. She has no wheezes. She has no rales. She exhibits no tenderness.  Abdominal: Soft. She exhibits no  distension. There is no tenderness. There is no rebound and no guarding.  Musculoskeletal: Normal range of motion.       No midline spinal tenderness to palpation.   Neurological: She is alert and oriented to person, place, and time. No cranial nerve deficit. Coordination normal.       Strength and sensation equal and intact bilaterally. Cerebellar testing done without difficulty. Speech is goal-oriented. Moves limbs without ataxia.   Skin: Skin is warm and dry. She is not diaphoretic.  Psychiatric: She has a normal mood and affect. Her behavior is normal.    ED Course  Procedures (including critical care time)   Date: 07/07/2012  Rate: 97  Rhythm: normal sinus rhythm  QRS Axis: left  Intervals: normal  ST/T Wave abnormalities: nonspecific T wave changes  Conduction Disutrbances:none  Narrative Interpretation: NSR with T wave inversion in leads aVR and V1  Old EKG Reviewed: none available    Labs Reviewed  BASIC METABOLIC PANEL - Abnormal; Notable for the following:    Glucose, Bld 111 (*)     All other components within normal limits  CBC WITH DIFFERENTIAL  TROPONIN I   Dg Chest 2 View  07/07/2012  *RADIOLOGY REPORT*  Clinical Data: Left arm numbness.  CHEST - 2 VIEW  Comparison: None.  Findings: Two views of the chest were obtained.  Lungs are clear bilaterally. Heart and mediastinum are within normal limits.  The trachea is midline and the bony thorax is intact.  IMPRESSION: No acute cardiopulmonary disease.   Original Report Authenticated By: Richarda Overlie, M.D.    Ct Head Wo Contrast  07/07/2012  *RADIOLOGY REPORT*  Clinical Data: Facial/left arm numbness  CT HEAD WITHOUT CONTRAST  Technique:  Contiguous axial images were obtained from the base of the skull through the vertex without contrast.  Comparison: 09/20/2006  Findings: No evidence of parenchymal hemorrhage or extra-axial fluid collection. No mass lesion, mass effect, or midline shift.  No CT evidence of acute  infarction.  Cerebral volume is age appropriate.  No ventriculomegaly.  The visualized paranasal sinuses are essentially clear. The mastoid air cells are unopacified.  No evidence of calvarial fracture.  IMPRESSION: Normal head CT.   Original Report Authenticated By: Charline Bills, M.D.      1. Left arm numbness       MDM  1:21 PM Labs pending. EKG unremarkable except for T wave inversion in aVR  and V1. Troponin pending. Patient denies current symptoms.   2:17 PM First troponin negative. Patient will have another troponin drawn at 3pm. If negative, patient can be discharged with follow up with her PCP. Plan and results discussed with patient and Dr. Anitra Lauth who understands and is agreeable to plan.   3:21 PM Patient verbalizing left sided facial numbness and a drooping sensation as well as difficulty speaking, which lasted a few minutes before resolving. I did not witness this and neither did the nurse but the mother did and is concerned about a stroke. Patient will have a head CT.   3:50 PM Head CT unremarkable. Second troponin negative. Patient will be discharged for follow up with PCP. Patient informed of results and is agreeable to plan.    Emilia Beck, PA-C 07/07/12 1605

## 2012-07-10 NOTE — ED Provider Notes (Signed)
Medical screening examination/treatment/procedure(s) were performed by non-physician practitioner and as supervising physician I was immediately available for consultation/collaboration.   Gwyneth Sprout, MD 07/10/12 1530

## 2012-12-06 ENCOUNTER — Other Ambulatory Visit: Payer: Self-pay | Admitting: Family Medicine

## 2012-12-06 DIAGNOSIS — R209 Unspecified disturbances of skin sensation: Secondary | ICD-10-CM

## 2012-12-17 ENCOUNTER — Ambulatory Visit
Admission: RE | Admit: 2012-12-17 | Discharge: 2012-12-17 | Disposition: A | Discharge status: Home / Self Care | Payer: Medicaid Other | Source: Ambulatory Visit | Attending: Family Medicine | Admitting: Family Medicine

## 2012-12-17 DIAGNOSIS — R209 Unspecified disturbances of skin sensation: Secondary | ICD-10-CM

## 2013-07-03 ENCOUNTER — Encounter: Payer: Self-pay | Admitting: Gynecology

## 2013-07-03 ENCOUNTER — Other Ambulatory Visit (HOSPITAL_COMMUNITY)
Admission: RE | Admit: 2013-07-03 | Discharge: 2013-07-03 | Disposition: A | Discharge status: Home / Self Care | Payer: Medicaid Other | Source: Ambulatory Visit | Attending: Gynecology | Admitting: Gynecology

## 2013-07-03 ENCOUNTER — Ambulatory Visit (INDEPENDENT_AMBULATORY_CARE_PROVIDER_SITE_OTHER): Payer: Medicaid Other | Admitting: Gynecology

## 2013-07-03 VITALS — BP 126/78 | Ht 63.25 in | Wt 184.2 lb

## 2013-07-03 DIAGNOSIS — Z01419 Encounter for gynecological examination (general) (routine) without abnormal findings: Secondary | ICD-10-CM | POA: Insufficient documentation

## 2013-07-03 DIAGNOSIS — N393 Stress incontinence (female) (male): Secondary | ICD-10-CM

## 2013-07-03 DIAGNOSIS — Z1151 Encounter for screening for human papillomavirus (HPV): Secondary | ICD-10-CM | POA: Insufficient documentation

## 2013-07-03 MED ORDER — OXYBUTYNIN 3.9 MG/24HR TD PTTW: MEDICATED_PATCH | TRANSDERMAL | Status: DC

## 2013-07-03 NOTE — Patient Instructions (Signed)
Oxybutynin skin patch What is this medicine? OXYBUTYNIN (ox i BYOO ti nin) is used to treat overactive bladder. This medicine reduces the amount of bathroom visits. It may also help to control wetting accidents. This medicine may be used for other purposes; ask your health care provider or pharmacist if you have questions. COMMON BRAND NAME(S): Oxytrol for Women, Oxytrol What should I tell my health care provider before I take this medicine? They need to know if you have any of these conditions: -dementia -difficulty passing urine -glaucoma -intestinal obstruction -kidney disease -liver disease -an unusual or allergic reaction to oxybutynin, other medicines, foods, dyes, or preservatives -pregnant or trying to get pregnant -breast-feeding How should I use this medicine? This medicine is for use on the skin. Follow the directions on the prescription label. Find an area of skin on your abdomen, hip, or backside that is clean, dry, greaseless, undamaged and hairless. Remove the patch from the sealed pouch. Do not cut or trim the patch. Using your palm, press the patch firmly in place to make sure that there is good contact with your skin. Change the patch two times per week, keeping to a regular schedule. When you apply a new patch, use a new area of skin. Wait at least 1 week before using the same area again. Talk to your pediatrician regarding the use of this medicine in children. Special care may be needed. Overdosage: If you think you have taken too much of this medicine contact a poison control center or emergency room at once. NOTE: This medicine is only for you. Do not share this medicine with others. What if I miss a dose? If you forget to replace a patch, use it as soon as you can. Only use one patch at a time and do not leave on the skin for longer than directed. If a patch falls off, you can replace it, but keep to your schedule and remove the patch at the right time. What may interact  with this medicine? -antihistamines for allergy, cough and cold -atropine -certain medicines for bladder problems like oxybutynin, tolterodine -certain medicines for Parkinson's disease like benztropine, trihexyphenidyl -certain medicines for stomach problems like dicyclomine, hyoscyamine -certain medicines for travel sickness like scopolamine -clarithromycin -erythromycin -ipratropium -medicines for fungal infections, like fluconazole, itraconazole, ketoconazole or voriconazole This list may not describe all possible interactions. Give your health care provider a list of all the medicines, herbs, non-prescription drugs, or dietary supplements you use. Also tell them if you smoke, drink alcohol, or use illegal drugs. Some items may interact with your medicine. What should I watch for while using this medicine? It may take a few weeks to notice the full benefit from this medicine. You may need to limit your intake tea, coffee, caffeinated sodas, and alcohol. These drinks may make your symptoms worse. You may get drowsy or dizzy. Do not drive, use machinery, or do anything that needs mental alertness until you know how this medicine affects you. Do not stand or sit up quickly, especially if you are an older patient. This reduces the risk of dizzy or fainting spells. Alcohol may interfere with the effect of this medicine. Avoid alcoholic drinks. Your mouth may get dry. Chewing sugarless gum or sucking hard candy, and drinking plenty of water may help. Contact your doctor if the problem does not go away or is severe. This medicine may cause dry eyes and blurred vision. If you wear contact lenses, you may feel some discomfort. Lubricating drops may  help. See your eyecare professional if the problem does not go away or is severe. Avoid extreme heat. This medicine can cause you to sweat less than normal. Your body temperature could increase to dangerous levels, which may lead to heat stroke. Do not expose  the patch to sunlight. You should wear it under your clothes. You can keep the patch in place during swimming, bathing, and showering. If your patch falls off during these activities, replace it. What side effects may I notice from receiving this medicine? Side effects that you should report to your doctor or health care professional as soon as possible: -allergic reactions like skin rash, itching or hives, swelling of the face, lips, or tongue -agitation -breathing problems -confusion -fever -flushing (reddening of the skin) -hallucinations -memory loss -pain or difficulty passing urine -palpitations -unusually weak or tired Side effects that usually do not require medical attention (report to your doctor or health care professional if they continue or are bothersome): -constipation -headache -sexual difficulties (impotence) This list may not describe all possible side effects. Call your doctor for medical advice about side effects. You may report side effects to FDA at 1-800-FDA-1088. Where should I keep my medicine? Keep out of the reach of children. Store at room temperature between 15 and 30 degrees C (59 and 86 degrees F). Protect from moisture and humidity. Do not remove from the package until you are ready to use. Protect from light. When you remove a patch, fold it in half with sticky sides together and throw away. Throw away unused medicine after the expiration date. NOTE: This sheet is a summary. It may not cover all possible information. If you have questions about this medicine, talk to your doctor, pharmacist, or health care provider.  2014, Elsevier/Gold Standard. (2008-03-05 17:24:23)

## 2013-07-03 NOTE — Progress Notes (Signed)
Megan Pace 1975-12-26 [id number]   History:    37 y.o.  for annual gyn exam with no major complaints except that time she has urgency incontinence and may get up may be once at night that wakes her up to urinate. She has modified her caffeine intake. She does not have to wear a pad daily. Patient has a Mirena IUD for contraception and is having very minimal if any menstrual cycles. Her flu vaccine as of today. She does her monthly breast exams.  Review of patient's records indicated that she had a LEEP cervical conization in 2002 and margins were clear. Subsequent Pap smears have been normal. She had a Mirena IUD placed in April 2011 and has done well otherwise. Patient has been followed by Dr. Kizzie Bane for depression and anxiety.  Past medical history,surgical history, family history and social history were all reviewed and documented in the EPIC chart.  Gynecologic History No LMP recorded. Patient is not currently having periods (Reason: IUD). Contraception: IUD Last Pap: 2013. Results were: normal Last mammogram: none indicated. Results were: none indicated  Obstetric History OB History  Gravida Para Term Preterm AB SAB TAB Ectopic Multiple Living  4 2 2  1 1    2     # Outcome Date GA Lbr Len/2nd Weight Sex Delivery Anes PTL Lv  4 TRM     F SVD  N Y  3 TRM     F SVD  N Y  2 SAB           1 GRA                ROS: A ROS was performed and pertinent positives and negatives are included in the history.  GENERAL: No fevers or chills. HEENT: No change in vision, no earache, sore throat or sinus congestion. NECK: No pain or stiffness. CARDIOVASCULAR: No chest pain or pressure. No palpitations. PULMONARY: No shortness of breath, cough or wheeze. GASTROINTESTINAL: No abdominal pain, nausea, vomiting or diarrhea, melena or bright red blood per rectum. GENITOURINARY: No urinary frequency, urgency, hesitancy or dysuria. MUSCULOSKELETAL: No joint or muscle pain, no back pain, no recent trauma.  DERMATOLOGIC: No rash, no itching, no lesions. ENDOCRINE: No polyuria, polydipsia, no heat or cold intolerance. No recent change in weight. HEMATOLOGICAL: No anemia or easy bruising or bleeding. NEUROLOGIC: No headache, seizures, numbness, tingling or weakness. PSYCHIATRIC: No depression, no loss of interest in normal activity or change in sleep pattern.     Exam: chaperone present  BP 126/78  Ht 5' 3.25" (1.607 m)  Wt 184 lb 3.2 oz (83.553 kg)  BMI 32.35 kg/m2  Body mass index is 32.35 kg/(m^2).  General appearance : Well developed well nourished female. No acute distress HEENT: Neck supple, trachea midline, no carotid bruits, no thyroidmegaly Lungs: Clear to auscultation, no rhonchi or wheezes, or rib retractions  Heart: Regular rate and rhythm, no murmurs or gallops Breast:Examined in sitting and supine position were symmetrical in appearance, no palpable masses or tenderness,  no skin retraction, no nipple inversion, no nipple discharge, no skin discoloration, no axillary or supraclavicular lymphadenopathy Abdomen: no palpable masses or tenderness, no rebound or guarding Extremities: no edema or skin discoloration or tenderness  Pelvic:  Bartholin, Urethra, Skene Glands: Within normal limits             Vagina: No gross lesions or discharge  Cervix: No gross lesions or discharge,IUD string seen with the endocervical speculum and colposcope  Uterus  anteverted,  normal size, shape and consistency, non-tender and mobile  Adnexa  Without masses or tenderness  Anus and perineum  normal   Rectovaginal  normal sphincter tone without palpated masses or tenderness             Hemoccult that indicated     Assessment/Plan:  37 y.o. 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 Pap smear was done today and we will continue to do so for close monitoring. Her PCP at  [facility] family practice will be drawn her blood work. For her urgency incontinence she will be started on Oxytrol patch to apply twice a week. Patient denies any history of glaucoma. The risks benefits and pros and cotton were discussed as well as literature information was provided. Patient was reminded on the importance of calcium and vitamin D in regular exercise for osteoporosis prevention as well as monthly breast exam.  Note: This dictation was prepared with  Dragon/digital dictation along withSmart phrase technology. Any transcriptional errors that result from this process are unintentional.   Ok Edwards MD, 5:09 PM 07/03/2013

## 2013-07-04 ENCOUNTER — Telehealth: Payer: Self-pay | Admitting: *Deleted

## 2013-07-04 MED ORDER — FESOTERODINE FUMARATE ER 4 MG PO TB24: 4.0000 mg | ORAL_TABLET | Freq: Every day | ORAL | Status: DC

## 2013-07-04 NOTE — Telephone Encounter (Signed)
Did not understand your question Victorino Dike please rephrase

## 2013-07-04 NOTE — Telephone Encounter (Signed)
rx for toviaz 4 mg sent, informed pharmacy regarding PA for oxytrol not being covered.

## 2013-07-04 NOTE — Telephone Encounter (Signed)
Please call a prescription for totally as 4 mg 1 by mouth daily #30 refill 11

## 2013-07-04 NOTE — Telephone Encounter (Signed)
Please prescribed one of the following medications oxybutynin or Gala Murdoch this is covered by medicaid.

## 2013-07-04 NOTE — Telephone Encounter (Signed)
Pt was prescribed Oxytrol 3.9 mg patch on 07/03/13 OV the patient must have tired and failed 2 preferred medication such as oxybutynin or toviaz. Please advise

## 2013-07-09 ENCOUNTER — Ambulatory Visit (HOSPITAL_BASED_OUTPATIENT_CLINIC_OR_DEPARTMENT_OTHER): Payer: Medicaid Other

## 2013-07-16 ENCOUNTER — Ambulatory Visit (HOSPITAL_BASED_OUTPATIENT_CLINIC_OR_DEPARTMENT_OTHER): Payer: Medicaid Other | Attending: Internal Medicine | Admitting: Radiology

## 2013-07-16 ENCOUNTER — Encounter (HOSPITAL_BASED_OUTPATIENT_CLINIC_OR_DEPARTMENT_OTHER): Payer: Self-pay | Admitting: Radiology

## 2013-07-16 VITALS — Ht 62.0 in | Wt 185.0 lb

## 2013-07-16 DIAGNOSIS — G4733 Obstructive sleep apnea (adult) (pediatric): Secondary | ICD-10-CM | POA: Insufficient documentation

## 2013-07-16 DIAGNOSIS — G4734 Idiopathic sleep related nonobstructive alveolar hypoventilation: Secondary | ICD-10-CM | POA: Insufficient documentation

## 2013-07-18 ENCOUNTER — Encounter: Payer: Self-pay | Admitting: Gynecology

## 2013-07-21 DIAGNOSIS — G4733 Obstructive sleep apnea (adult) (pediatric): Secondary | ICD-10-CM

## 2013-07-21 NOTE — Sleep Study (Signed)
[facility]  NAME: Megan Pace DATE OF BIRTH:  [birth year] MEDICAL RECORD NUMBER [id number]  LOCATION: [facility]  PHYSICIAN: Tylor Courtwright D  DATE OF STUDY: 07/16/2013  SLEEP STUDY TYPE: Nocturnal Polysomnogram               REFERRING PHYSICIAN: Darnelle Bos,*  INDICATION FOR STUDY: Hypersomnia with sleep apnea   EPWORTH SLEEPINESS SCORE:   10/24 HEIGHT: 5\' 2"  (157.5 cm)  WEIGHT: 83.915 kg (185 lb)    Body mass index is 33.83 kg/(m^2).  NECK SIZE: 14 in.  MEDICATIONS: Charted for review  SLEEP ARCHITECTURE: Total sleep time 331.5 minutes with sleep efficiency 83.6%. Stage I was 11.8%, stage II 74.4%, stage III 2.6%, REM 11.3% of total sleep time. Sleep latency 15.5 minutes, REM latency 172 minutes, awake after sleep onset 49.5 minutes. Arousal index 18.5. Bedtime medication: None  RESPIRATORY DATA: Apnea hypopnea index (AHI) 13.6 per hour. A total of 75 events was scored including 4 obstructive apneas and 71 hypopneas. Events were more common while supine. REM AHI 4.8 per hour. This study was scheduled as an NPSG protocol only and  CPAP was not titrated.  OXYGEN DATA: Occasional snoring was moderately loud, with oxygen desaturation to a nadir of 84% and mean oxygen saturation through the study of 93.4% on room air.  CARDIAC DATA: Normal sinus rhythm   MOVEMENT/PARASOMNIA: No significant movement disturbance. No bathroom trips.  IMPRESSION/ RECOMMENDATION:   1) Mild obstructive sleep apnea/hypoxia syndrome, AHI 13.6 per hour with mostly supine events. REM AHI of 4.8 per hour. Occasional snoring was moderate, with oxygen desaturation to a nadir of 84% and mean oxygen saturation through the study of 93.4% on room air. 2) This study was ordered as a diagnostic NPSG protocol without CPAP.  Signed Jetty Duhamel M.D. Waymon Budge Diplomate, American Board of Sleep Medicine  ELECTRONICALLY SIGNED ON:  07/21/2013, 3:53  [facility] PH: [phone]   FX: [phone] ACCREDITED BY THE AMERICAN ACADEMY OF SLEEP MEDICINE

## 2014-06-24 ENCOUNTER — Encounter (HOSPITAL_BASED_OUTPATIENT_CLINIC_OR_DEPARTMENT_OTHER): Payer: Self-pay | Admitting: Radiology

## 2015-02-05 ENCOUNTER — Encounter: Payer: Medicaid Other | Admitting: Gynecology

## 2015-02-07 ENCOUNTER — Other Ambulatory Visit (HOSPITAL_COMMUNITY)
Admission: RE | Admit: 2015-02-07 | Discharge: 2015-02-07 | Disposition: A | Discharge status: Home / Self Care | Payer: Managed Care, Other (non HMO) | Source: Ambulatory Visit | Attending: Gynecology | Admitting: Gynecology

## 2015-02-07 ENCOUNTER — Encounter: Payer: Self-pay | Admitting: Gynecology

## 2015-02-07 ENCOUNTER — Ambulatory Visit (INDEPENDENT_AMBULATORY_CARE_PROVIDER_SITE_OTHER): Payer: Managed Care, Other (non HMO) | Admitting: Gynecology

## 2015-02-07 VITALS — BP 138/86 | Ht 63.25 in | Wt 184.0 lb

## 2015-02-07 DIAGNOSIS — R002 Palpitations: Secondary | ICD-10-CM

## 2015-02-07 DIAGNOSIS — Z01419 Encounter for gynecological examination (general) (routine) without abnormal findings: Secondary | ICD-10-CM

## 2015-02-07 DIAGNOSIS — Z87410 Personal history of cervical dysplasia: Secondary | ICD-10-CM | POA: Diagnosis not present

## 2015-02-07 NOTE — Progress Notes (Signed)
Megan Pace 1976-08-07 [id number]   History:    39 y.o.  for annual gyn exam with no major complaints on and off low abdominal discomfort during time of ovulation. Several years ago patient had been referred to the cardiologist because of palpitations and she had a 24-hour event monitor. She states her symptoms have started again but she does not remember the cardiologist that she saw before. She denies passing out or any numbness or tingling on any of her extremities.  Patient had a Mirena IUD placed in April 2011 and is due to be changed. Also review of patient's records indicated that she had a LEEP cervical conization in 2002 and margins were clear. Subsequent Pap smears have been normal. .Patient has been followed by Dr. Ysidro Evert for depression and anxiety.  Patient herself had gestational diabetes and one of her pregnancy and her mother has insulin-dependent diabetes. She is overweight currently went her native 4 pounds the same as last year and has BMI 32.34 kg for meter square. Patient is overdue for her mammogram as well.  Past medical history,surgical history, family history and social history were all reviewed and documented in the EPIC chart.  Gynecologic History No LMP recorded. Patient is not currently having periods (Reason: IUD). Contraception: IUD Last Pap: 2014. Results were: normal Last mammogram: 2011. Results were: normal  Obstetric History OB History  Gravida Para Term Preterm AB SAB TAB Ectopic Multiple Living  4 2 2  1 1    2     # Outcome Date GA Lbr Len/2nd Weight Sex Delivery Anes PTL Lv  4 SAB           3 Gravida           2 Term     F Vag-Spont  N Y  1 Term     F Vag-Spont  N Y       ROS: A ROS was performed and pertinent positives and negatives are included in the history.  GENERAL: No fevers or chills. HEENT: No change in vision, no earache, sore throat or sinus congestion. NECK: No pain or stiffness. CARDIOVASCULAR: Palpitations. PULMONARY: No  shortness of breath, cough or wheeze. GASTROINTESTINAL: No abdominal pain, nausea, vomiting or diarrhea, melena or bright red blood per rectum. GENITOURINARY: No urinary frequency, urgency, hesitancy or dysuria. MUSCULOSKELETAL: No joint or muscle pain, no back pain, no recent trauma. DERMATOLOGIC: No rash, no itching, no lesions. ENDOCRINE: No polyuria, polydipsia, no heat or cold intolerance. No recent change in weight. HEMATOLOGICAL: No anemia or easy bruising or bleeding. NEUROLOGIC: No headache, seizures, numbness, tingling or weakness. PSYCHIATRIC: No depression, no loss of interest in normal activity or change in sleep pattern.     Exam: chaperone present  BP 138/86 mmHg  Ht 5' 3.25" (1.607 m)  Wt 184 lb (83.462 kg)  BMI 32.32 kg/m2  Body mass index is 32.32 kg/(m^2).  General appearance : Well developed well nourished female. No acute distress HEENT: Eyes: no retinal hemorrhage or exudates,  Neck supple, trachea midline, no carotid bruits, no thyroidmegaly Lungs: Clear to auscultation, no rhonchi or wheezes, or rib retractions  Heart: Grade 1/6 systolic ejection murmur Breast:Examined in sitting and supine position were symmetrical in appearance, no palpable masses or tenderness,  no skin retraction, no nipple inversion, no nipple discharge, no skin discoloration, no axillary or supraclavicular lymphadenopathy Abdomen: no palpable masses or tenderness, no rebound or guarding Extremities: no edema or skin discoloration or tenderness  Pelvic:  Bartholin,  Urethra, Skene Glands: Within normal limits             Vagina: No gross lesions or discharge  Cervix: No gross lesions or discharge  Uterus  anteverted, normal size, shape and consistency, non-tender and mobile  Adnexa  Without masses or tenderness  Anus and perineum  normal   Rectovaginal  normal sphincter tone without palpated masses or tenderness             Hemoccult not indicated     Assessment/Plan:  39 y.o. female for  annual exam who will return to the office next week in a fasting state to have the following screening blood test: Comprehensive metabolic panel, fasting lipid profile, TSH, CBC, and urinalysis. She will also have her IUD changed to a new Mirena IUD at the same time. Literature information on exercise and cholesterol lowering diet was provided. We discussed importance of regular exercise. A requisition to schedule her mammogram was provided as well. Pap smear was done today. We discussed importance of monthly breast exams as well as. She is going to contact us with any with her cardiologist that she saw before so that we can schedule consultation.   Terrance Mass MD, 12:46 PM 02/07/2015

## 2015-02-07 NOTE — Patient Instructions (Signed)
Patient information: High cholesterol (The Basics)  What is cholesterol? - Cholesterol is a substance that is found in the blood. Everyone has some. It is needed for good health. The problem is, people sometimes have too much cholesterol. Compared with people with normal cholesterol, people with high cholesterol have a higher risk of heart attacks, strokes, and other health problems. The higher your cholesterol, the higher your risk of these problems. Cholesterol levels in your body are determined significantly by your diet. Cholesterol levels may also be related to heart disease. The following material helps to explain this relationship and discusses what you can do to help keep your heart healthy. Not all cholesterol is bad. Low-density lipoprotein (LDL) cholesterol is the "bad" cholesterol. It may cause fatty deposits to build up inside your arteries. High-density lipoprotein (HDL) cholesterol is "good." It helps to remove the "bad" LDL cholesterol from your blood. Cholesterol is a very important risk factor for heart disease. Other risk factors are high blood pressure, smoking, stress, heredity, and weight.  The heart muscle gets its supply of blood through the coronary arteries. If your LDL cholesterol is high and your HDL cholesterol is low, you are at risk for having fatty deposits build up in your coronary arteries. This leaves less room through which blood can flow. Without sufficient blood and oxygen, the heart muscle cannot function properly and you may feel chest pains (angina pectoris). When a coronary artery closes up entirely, a part of the heart muscle may die, causing a heart attack (myocardial infarction).  CHECKING CHOLESTEROL When your caregiver sends your blood to a lab to be analyzed for cholesterol, a complete lipid (fat) profile may be done. With this test, the total amount of cholesterol and levels of LDL and HDL are determined. Triglycerides  are a type of fat that circulates in the blood and can also be used to determine heart disease risk. Are there different types of cholesterol? - Yes, there are a few different types. If you get a cholesterol test, you may hear your doctor or nurse talk about: Total cholesterol  LDL cholesterol - Some people call this the "bad" cholesterol. That's because having high LDL levels raises your risk of heart attacks, strokes, and other health problems.  HDL cholesterol - Some people call this the "good" cholesterol. That's because having high HDL levels lowers your risk of heart attacks, strokes, and other health problems.  Non-HDL cholesterol - Non-HDL cholesterol is your total cholesterol minus your HDL cholesterol.  Triglycerides - Triglycerides are not cholesterol. They are a type of fat. But they often get measured when cholesterol is measured. (Having high triglycerides also seems to increase the risk of heart attacks and strokes.)   Keep in mind, though, that many people who cannot meet these goals still have a low risk of heart attacks and strokes. What should I do if my doctor tells me I have high cholesterol? - Ask your doctor what your overall risk of heart attacks and strokes is. High cholesterol, by itself, is not always a reason to worry. Having high cholesterol is just one of many things that can increase your risk of heart attacks and strokes. Other factors that increase your risk include:  Cigarette smoking  High blood pressure  Having a parent, sister, or brother who got heart disease at a young age (Young, in this case, means younger than 55 for men and younger than 65 for women.)  Being a man (Women are at risk, too, but men   have a higher risk.)  Older age  If you are at high risk of heart attacks and strokes, having high cholesterol is a problem. On the other hand, if you have are at low risk, having high cholesterol may not mean much. Should I take medicine to lower cholesterol? - Not  everyone who has high cholesterol needs medicines. Your doctor or nurse will decide if you need them based on your age, family history, and other health concerns.  You should probably take a cholesterol-lowering medicine called a statin if you: Already had a heart attack or stroke  Have known heart disease  Have diabetes  Have a condition called peripheral artery disease, which makes it painful to walk, and happens when the arteries in your legs get clogged with fatty deposits  Have an abdominal aortic aneurysm, which is a widening of the main artery in the belly  Most people with any of the conditions listed above should take a statin no matter what their cholesterol level is. If your doctor or nurse puts you on a statin, stay on it. The medicine may not make you feel any different. But it can help prevent heart attacks, strokes, and death.  Can I lower my cholesterol without medicines? - Yes, you can lower your cholesterol some by:  Avoiding red meat, butter, fried foods, cheese, and other foods that have a lot of saturated fat  Losing weight (if you are overweight)  Being more active Even if these steps do little to change your cholesterol, they can improve your health in many ways.                                                   Cholesterol Control Diet  CONTROLLING CHOLESTEROL WITH DIET Although exercise and lifestyle factors are important, your diet is key. That is because certain foods are known to raise cholesterol and others to lower it. The goal is to balance foods for their effect on cholesterol and more importantly, to replace saturated and trans fat with other types of fat, such as monounsaturated fat, polyunsaturated fat, and omega-3 fatty acids. On average, a person should consume no more than 15 to 17 g of saturated fat daily. Saturated and trans fats are considered "bad" fats, and they will raise LDL cholesterol. Saturated fats are primarily found in animal products such as  meats, butter, and cream. However, that does not mean you need to sacrifice all your favorite foods. Today, there are good tasting, low-fat, low-cholesterol substitutes for most of the things you like to eat. Choose low-fat or nonfat alternatives. Choose round or loin cuts of red meat, since these types of cuts are lowest in fat and cholesterol. Chicken (without the skin), fish, veal, and ground turkey breast are excellent choices. Eliminate fatty meats, such as hot dogs and salami. Even shellfish have little or no saturated fat. Have a 3 oz (85 g) portion when you eat lean meat, poultry, or fish. Trans fats are also called "partially hydrogenated oils." They are oils that have been scientifically manipulated so that they are solid at room temperature resulting in a longer shelf life and improved taste and texture of foods in which they are added. Trans fats are found in stick margarine, some tub margarines, cookies, crackers, and baked goods.  When baking and cooking, oils are an excellent substitute   for butter. The monounsaturated oils are especially beneficial since it is believed they lower LDL and raise HDL. The oils you should avoid entirely are saturated tropical oils, such as coconut and palm.  Remember to eat liberally from food groups that are naturally free of saturated and trans fat, including fish, fruit, vegetables, beans, grains (barley, rice, couscous, bulgur wheat), and pasta (without cream sauces).  IDENTIFYING FOODS THAT LOWER CHOLESTEROL  Soluble fiber may lower your cholesterol. This type of fiber is found in fruits such as apples, vegetables such as broccoli, potatoes, and carrots, legumes such as beans, peas, and lentils, and grains such as barley. Foods fortified with plant sterols (phytosterol) may also lower cholesterol. You should eat at least 2 g per day of these foods for a cholesterol lowering effect.  Read package labels to identify low-saturated fats, trans fats free, and  low-fat foods at the supermarket. Select cheeses that have only 2 to 3 g saturated fat per ounce. Use a heart-healthy tub margarine that is free of trans fats or partially hydrogenated oil. When buying baked goods (cookies, crackers), avoid partially hydrogenated oils. Breads and muffins should be made from whole grains (whole-wheat or whole oat flour, instead of "flour" or "enriched flour"). Buy non-creamy canned soups with reduced salt and no added fats.  FOOD PREPARATION TECHNIQUES  Never deep-fry. If you must fry, either stir-fry, which uses very little fat, or use non-stick cooking sprays. When possible, broil, bake, or roast meats, and steam vegetables. Instead of dressing vegetables with butter or margarine, use lemon and herbs, applesauce and cinnamon (for squash and sweet potatoes), nonfat yogurt, salsa, and low-fat dressings for salads.  LOW-SATURATED FAT / LOW-FAT FOOD SUBSTITUTES Meats / Saturated Fat (g)  Avoid: Steak, marbled (3 oz/85 g) / 11 g   Choose: Steak, lean (3 oz/85 g) / 4 g   Avoid: Hamburger (3 oz/85 g) / 7 g   Choose: Hamburger, lean (3 oz/85 g) / 5 g   Avoid: Ham (3 oz/85 g) / 6 g   Choose: Ham, lean cut (3 oz/85 g) / 2.4 g   Avoid: Chicken, with skin, dark meat (3 oz/85 g) / 4 g   Choose: Chicken, skin removed, dark meat (3 oz/85 g) / 2 g   Avoid: Chicken, with skin, light meat (3 oz/85 g) / 2.5 g   Choose: Chicken, skin removed, light meat (3 oz/85 g) / 1 g  Dairy / Saturated Fat (g)  Avoid: Whole milk (1 cup) / 5 g   Choose: Low-fat milk, 2% (1 cup) / 3 g   Choose: Low-fat milk, 1% (1 cup) / 1.5 g   Choose: Skim milk (1 cup) / 0.3 g   Avoid: Hard cheese (1 oz/28 g) / 6 g   Choose: Skim milk cheese (1 oz/28 g) / 2 to 3 g   Avoid: Cottage cheese, 4% fat (1 cup) / 6.5 g   Choose: Low-fat cottage cheese, 1% fat (1 cup) / 1.5 g   Avoid: Ice cream (1 cup) / 9 g   Choose: Sherbet (1 cup) / 2.5 g   Choose: Nonfat frozen yogurt (1 cup) / 0.3 g    Choose: Frozen fruit bar / trace   Avoid: Whipped cream (1 tbs) / 3.5 g   Choose: Nondairy whipped topping (1 tbs) / 1 g  Condiments / Saturated Fat (g)  Avoid: Mayonnaise (1 tbs) / 2 g   Choose: Low-fat mayonnaise (1 tbs) / 1 g   Avoid:   Butter (1 tbs) / 7 g   Choose: Extra light margarine (1 tbs) / 1 g   Avoid: Coconut oil (1 tbs) / 11.8 g   Choose: Olive oil (1 tbs) / 1.8 g   Choose: Corn oil (1 tbs) / 1.7 g   Choose: Safflower oil (1 tbs) / 1.2 g   Choose: Sunflower oil (1 tbs) / 1.4 g   Choose: Soybean oil (1 tbs) / 2.4 g   Choose: Canola oil (1 tbs) / 1 g  Exercise to Lose Weight Exercise and a healthy diet may help you lose weight. Your doctor may suggest specific exercises. EXERCISE IDEAS AND TIPS  Choose low-cost things you enjoy doing, such as walking, bicycling, or exercising to workout videos.   Take stairs instead of the elevator.   Walk during your lunch break.   Park your car further away from work or school.   Go to a gym or an exercise class.   Start with 5 to 10 minutes of exercise each day. Build up to 30 minutes of exercise 4 to 6 days a week.   Wear shoes with good support and comfortable clothes.   Stretch before and after working out.   Work out until you breathe harder and your heart beats faster.   Drink extra water when you exercise.   Do not do so much that you hurt yourself, feel dizzy, or get very short of breath.  Exercises that burn about 150 calories:  Running 1  miles in 15 minutes.   Playing volleyball for 45 to 60 minutes.   Washing and waxing a car for 45 to 60 minutes.   Playing touch football for 45 minutes.   Walking 1  miles in 35 minutes.   Pushing a stroller 1  miles in 30 minutes.   Playing basketball for 30 minutes.   Raking leaves for 30 minutes.   Bicycling 5 miles in 30 minutes.   Walking 2 miles in 30 minutes.   Dancing for 30 minutes.   Shoveling snow for 15 minutes.   Swimming laps  for 20 minutes.   Walking up stairs for 15 minutes.   Bicycling 4 miles in 15 minutes.   Gardening for 30 to 45 minutes.   Jumping rope for 15 minutes.   Washing windows or floors for 45 to 60 minutes.  Document Released: 09/11/2010 Document Revised: 04/21/2011 Document Reviewed: 09/11/2010 [employer] Patient Information 2012 [employer].                                           

## 2015-02-08 LAB — URINALYSIS W MICROSCOPIC + REFLEX CULTURE
BILIRUBIN URINE: NEGATIVE
CASTS: NONE SEEN
Crystals: NONE SEEN
GLUCOSE, UA: NEGATIVE mg/dL
Hgb urine dipstick: NEGATIVE
Ketones, ur: NEGATIVE mg/dL
LEUKOCYTES UA: NEGATIVE
Nitrite: NEGATIVE
PH: 6.5 (ref 5.0–8.0)
Protein, ur: NEGATIVE mg/dL
SPECIFIC GRAVITY, URINE: 1.025 (ref 1.005–1.030)
Urobilinogen, UA: 0.2 mg/dL (ref 0.0–1.0)

## 2015-02-10 ENCOUNTER — Telehealth: Payer: Self-pay | Admitting: Gynecology

## 2015-02-10 ENCOUNTER — Other Ambulatory Visit: Payer: Managed Care, Other (non HMO)

## 2015-02-10 NOTE — Telephone Encounter (Signed)
02/10/15-Pt was advised today that her Megan Pace ins covers the replacement of current Mirena for contraception at 100%, no copay or coinsurance. This plan ends on 02/20/15 and pt was so advised. 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

## 2015-02-11 LAB — CYTOLOGY - PAP

## 2015-02-25 ENCOUNTER — Telehealth: Payer: Self-pay | Admitting: Gynecology

## 2015-02-25 NOTE — Telephone Encounter (Signed)
02/25/15-I rechecked pt benefits for the Mirena as her plan changed as of 02/21/15. The benefit is the same that they will cover this at 100%, no copay for contraception. I LM VM for pt with this information. Appt is already made.wl

## 2015-03-04 ENCOUNTER — Encounter: Payer: Self-pay | Admitting: *Deleted

## 2015-03-07 ENCOUNTER — Encounter: Payer: Medicaid Other | Admitting: Gynecology

## 2015-03-10 ENCOUNTER — Encounter: Payer: Self-pay | Admitting: Cardiovascular Disease

## 2015-03-11 ENCOUNTER — Ambulatory Visit (INDEPENDENT_AMBULATORY_CARE_PROVIDER_SITE_OTHER): Payer: Managed Care, Other (non HMO) | Admitting: Gynecology

## 2015-03-11 ENCOUNTER — Encounter: Payer: Self-pay | Admitting: Gynecology

## 2015-03-11 VITALS — BP 132/84 | Ht 62.0 in | Wt 184.0 lb

## 2015-03-11 DIAGNOSIS — Z30433 Encounter for removal and reinsertion of intrauterine contraceptive device: Secondary | ICD-10-CM | POA: Diagnosis not present

## 2015-03-11 DIAGNOSIS — Z975 Presence of (intrauterine) contraceptive device: Secondary | ICD-10-CM | POA: Insufficient documentation

## 2015-03-11 NOTE — Patient Instructions (Signed)

## 2015-03-11 NOTE — Progress Notes (Signed)
   Patient is a 39 year old who presented to the office today to remove her expired Mirena IUD with insertion of a new one. She has done well. Patient had a complete gynecological examination on 02/07/2015 see note for previous detail.                                                                    IUD procedure note       Patient presented to the office today for her outdated Mirena IUD and placement of Mirena IUD. The patient had previously been provided with literature information on this method of contraception. The risks benefits and pros and cons were discussed and all her questions were answered. She is fully aware that this form of contraception is 99% effective and is good for 5 years.  Pelvic exam: Bartholin urethra Skene glands: Within normal limits Vagina: No lesions or discharge Cervix: No lesions or discharge, the IUD string was visualized and grasped with a [place] clamp retrieved shown to patient and discarded.  Uterus: Retroverted position Adnexa: No masses or tenderness Rectal exam: Not done  The cervix was cleansed with Betadine solution. A single-tooth tenaculum was placed on the anterior cervical lip. The uterus sounded to 7 centimeter. The IUD was shown to the patient and inserted in a sterile fashion. The IUD string was trimmed. The single-tooth tenaculum was removed. Patient was instructed to return back to the office in one month for follow up.

## 2015-03-12 ENCOUNTER — Encounter: Payer: Self-pay | Admitting: Gynecology

## 2015-03-26 ENCOUNTER — Encounter: Payer: Self-pay | Admitting: Cardiovascular Disease

## 2015-04-14 ENCOUNTER — Ambulatory Visit (INDEPENDENT_AMBULATORY_CARE_PROVIDER_SITE_OTHER): Payer: Managed Care, Other (non HMO) | Admitting: Gynecology

## 2015-04-14 ENCOUNTER — Encounter: Payer: Self-pay | Admitting: Gynecology

## 2015-04-14 VITALS — BP 124/84

## 2015-04-14 DIAGNOSIS — Z30431 Encounter for routine checking of intrauterine contraceptive device: Secondary | ICD-10-CM

## 2015-04-14 LAB — COMPREHENSIVE METABOLIC PANEL
ALBUMIN: 4 g/dL (ref 3.6–5.1)
ALT: 15 U/L (ref 6–29)
AST: 13 U/L (ref 10–30)
Alkaline Phosphatase: 71 U/L (ref 33–115)
BUN: 11 mg/dL (ref 7–25)
CALCIUM: 8.8 mg/dL (ref 8.6–10.2)
CHLORIDE: 103 mmol/L (ref 98–110)
CO2: 25 mmol/L (ref 20–31)
Creat: 0.97 mg/dL (ref 0.50–1.10)
GLUCOSE: 113 mg/dL — AB (ref 65–99)
POTASSIUM: 4.1 mmol/L (ref 3.5–5.3)
Sodium: 141 mmol/L (ref 135–146)
Total Bilirubin: 0.4 mg/dL (ref 0.2–1.2)
Total Protein: 6.8 g/dL (ref 6.1–8.1)

## 2015-04-14 LAB — LIPID PANEL
CHOL/HDL RATIO: 6.5 ratio — AB (ref <=5.0)
Cholesterol: 181 mg/dL (ref 125–200)
HDL: 28 mg/dL — AB (ref >=46)
LDL CALC: 101 mg/dL (ref <130)
TRIGLYCERIDES: 261 mg/dL — AB (ref <150)
VLDL: 52 mg/dL — AB (ref <30)

## 2015-04-14 LAB — TSH: TSH: 2.099 u[IU]/mL (ref 0.350–4.500)

## 2015-04-14 NOTE — Progress Notes (Signed)
   Patient presented to the office today for 1 month follow-up after having changed her Mirena IUD from expired device doing U1. She is having no complaints.  Exam: Blood pressure 09/15/1982 Gen. appearance well-developed wondered female no acute distress Abdomen: Soft nontender no rebound or guarding Pelvic: Bartholin urethra Skene was within normal limits Vagina: No lesions or discharge Cervix: No lesions or discharge IUD string was seen with endocervical speculum Uterus: Anteverted normal size shape and consistency Adnexa: No palpable mass or tenderness Rectal exam not done  Assessment/plan: Patient one month status post laser and of a new Mirena IUD device doing well. Patient is due for annual exam next year. She was reminded to schedule her overdue mammogram.

## 2015-04-15 LAB — CBC WITH DIFFERENTIAL/PLATELET
Basophils Absolute: 0 10*3/uL (ref 0.0–0.1)
Basophils Relative: 0 % (ref 0–1)
EOS PCT: 2 % (ref 0–5)
Eosinophils Absolute: 0.2 10*3/uL (ref 0.0–0.7)
HEMATOCRIT: 41.9 % (ref 36.0–46.0)
HEMOGLOBIN: 14 g/dL (ref 12.0–15.0)
LYMPHS PCT: 46 % (ref 12–46)
Lymphs Abs: 3.9 10*3/uL (ref 0.7–4.0)
MCH: 28.3 pg (ref 26.0–34.0)
MCHC: 33.4 g/dL (ref 30.0–36.0)
MCV: 84.6 fL (ref 78.0–100.0)
MONO ABS: 0.4 10*3/uL (ref 0.1–1.0)
MONOS PCT: 5 % (ref 3–12)
MPV: 9.4 fL (ref 8.6–12.4)
NEUTROS ABS: 4 10*3/uL (ref 1.7–7.7)
Neutrophils Relative %: 47 % (ref 43–77)
Platelets: 387 10*3/uL (ref 150–400)
RBC: 4.95 MIL/uL (ref 3.87–5.11)
RDW: 13.8 % (ref 11.5–15.5)
WBC: 8.5 10*3/uL (ref 4.0–10.5)

## 2015-06-23 ENCOUNTER — Telehealth: Payer: Self-pay | Admitting: *Deleted

## 2015-06-23 DIAGNOSIS — R1030 Lower abdominal pain, unspecified: Secondary | ICD-10-CM

## 2015-06-23 NOTE — Telephone Encounter (Signed)
Ultrasound with OV same day anytime this week schedule would allow

## 2015-06-23 NOTE — Telephone Encounter (Signed)
Patient aware order placed, transferred for front desk to schedule.

## 2015-06-23 NOTE — Telephone Encounter (Signed)
Pt has Mirena IUD placed in July this year, states on Friday she woke up with a heavy fully feeling in lower abdomen, after emptying her bladder she had sharp pain, took ibuprofen and pain subsided. Pt went to [facility] on Saturday they did urine which was negative for infection. States today her pain scale is a 3 out of 10 has lower abdomen is tender to touch as it was on Saturday, states off and on for about 1 year she has had tenderness in lower abdomen. No cycle due to IUD, would you like to see patient today? Or okay to wait later this week and order ultrasound with OV? Please advise

## 2015-06-26 ENCOUNTER — Other Ambulatory Visit: Payer: Self-pay | Admitting: Gynecology

## 2015-06-26 ENCOUNTER — Ambulatory Visit (INDEPENDENT_AMBULATORY_CARE_PROVIDER_SITE_OTHER): Payer: Managed Care, Other (non HMO) | Admitting: Gynecology

## 2015-06-26 ENCOUNTER — Ambulatory Visit (INDEPENDENT_AMBULATORY_CARE_PROVIDER_SITE_OTHER): Payer: Managed Care, Other (non HMO)

## 2015-06-26 ENCOUNTER — Encounter: Payer: Self-pay | Admitting: Gynecology

## 2015-06-26 DIAGNOSIS — R102 Pelvic and perineal pain: Secondary | ICD-10-CM | POA: Diagnosis not present

## 2015-06-26 DIAGNOSIS — N831 Corpus luteum cyst of ovary, unspecified side: Secondary | ICD-10-CM

## 2015-06-26 DIAGNOSIS — R1030 Lower abdominal pain, unspecified: Secondary | ICD-10-CM | POA: Diagnosis not present

## 2015-06-26 DIAGNOSIS — N888 Other specified noninflammatory disorders of cervix uteri: Secondary | ICD-10-CM

## 2015-06-26 NOTE — Progress Notes (Signed)
   Patient is a 39 year old who presented to the office today stating that she had some low pelvic pressure several days ago underwent an urgent care to get a urinalysis there was no evidence of urinary tract infection. Today she is totally asymptomatic no dysuria no frequency no back pain no fever, chills, nausea or vomiting. Patient was last seen in the office on August 22 for her 1 month follow-up after having placed the Mirena IUD replaced with a new one. No irregular bleeding reported. Patient not sexually active.  Exam: Abdomen: Soft nontender no rebound or guarding Back: No CVA tenderness Pelvic: Bartholin urethra Skene was within normal limits Vagina: Some dark menstrual blood was present Cervix: IUD strings visualized Uterus anteverted normal size shape and consistency Adnexa: No palpable masses or tenderness Rectal exam not done  Ultrasound: Uterus 8.2 x 4.9 x 3.5 cm with endometrial stripe of 5.5 mm. IUD was seen in the normal position. A small nabothian cyst was noted at the cervix. Right ovary was normal. Left ovary with a thick wall corpus luteum cyst measuring 11 x 11 mm with positive color flow in the periphery no fluid in the cul-de-sac.  Assessment/plan: Patient several days ago with low abdominal discomfort could've been time of ovulation? Patient totally asymptomatic today receive urinalysis negative. Normal ultrasound otherwise with IUD in the proper position. She was reassured. Patient to return back to the office next year for annual exam or when necessary.

## 2016-01-02 ENCOUNTER — Telehealth: Payer: Self-pay | Admitting: Cardiovascular Disease

## 2016-01-02 NOTE — Telephone Encounter (Signed)
Closed Encounter  °

## 2016-01-05 ENCOUNTER — Telehealth: Payer: Self-pay | Admitting: Cardiovascular Disease

## 2016-01-05 NOTE — Telephone Encounter (Signed)
Received records from [address] for appointment on 01/23/16 with Dr Oval Linsey.  Records given to [facility] (medical records) for Dr Blenda Mounts schedule on 01/23/16. lp

## 2016-01-23 ENCOUNTER — Encounter (INDEPENDENT_AMBULATORY_CARE_PROVIDER_SITE_OTHER): Payer: Managed Care, Other (non HMO)

## 2016-01-23 ENCOUNTER — Ambulatory Visit (INDEPENDENT_AMBULATORY_CARE_PROVIDER_SITE_OTHER): Payer: Managed Care, Other (non HMO) | Admitting: Cardiovascular Disease

## 2016-01-23 ENCOUNTER — Encounter: Payer: Self-pay | Admitting: Cardiovascular Disease

## 2016-01-23 VITALS — BP 142/94 | HR 87 | Ht 62.0 in | Wt 194.0 lb

## 2016-01-23 DIAGNOSIS — R0602 Shortness of breath: Secondary | ICD-10-CM | POA: Diagnosis not present

## 2016-01-23 DIAGNOSIS — R002 Palpitations: Secondary | ICD-10-CM

## 2016-01-23 DIAGNOSIS — R600 Localized edema: Secondary | ICD-10-CM

## 2016-01-23 DIAGNOSIS — E669 Obesity, unspecified: Secondary | ICD-10-CM

## 2016-01-23 DIAGNOSIS — I10 Essential (primary) hypertension: Secondary | ICD-10-CM | POA: Diagnosis not present

## 2016-01-23 NOTE — Progress Notes (Signed)
Cardiology Office Note   Date:  01/23/2016   ID:  Megan Pace, DOB Dec 27, 1975, MRN [id number]  PCP:  Megan Solo, MD  Cardiologist:   Megan Latch, MD   Chief Complaint  Patient presents with  . New Evaluation    Referred by Megan Nam, MD for Palpitations & Tachycardia  pt c/o feeling fluid rushing through her heart and occasionally becomes lightheaded; SOB on exertion; swelling in legs/feet/ankles/forehead--wears a wig, and has noticed an indention when she takes it off       History of Present Illness: Megan Pace is a 40 y.o. female with hypertension, depression, anxiety, OSA on CPAP, and DM who presents for an evaluation of palpitations.  Megan Pace saw her PCP, Dr. Kathyrn Pace on 12/31/15.  At that appointment she reported intermittent episodes of palpitations that were occurring more frequently lately. Her exam and vitals were unremarkable. She was referred to cardiology for further evaluation.  Megan Pace notices that her heart is frequently elevated.  She feels like there is a rush of fluid that goes across her chest when laying down.  It also occurs during the day at times.  The episodes last from seconds to one minute and they occur one to three times per week. She has not noted chest pain but does have chest pressure.  This occurs both at rest or with exertion.  There is no associated shortness of breath, nausea, or emeisis but she does feel lightheaded.  She denies syncope.    Megan Pace does drink caffeinated drinks but abstained from two weeks and still had symptoms.  She does not get much exercise.  She gets short of breath and notes heart racing when she tries to walk.  Megan Pace was evaluated seven years ago for palpitations.    She has noticed lower extremity edema and orthopnea. However, she denies PND.    In the past she was on BP medication.  She sometimes notes headaches and checks her blood pressure at home.  It sometimes is in the 140s-150/90s  but mostly it is within normal limits.    Past Medical History  Diagnosis Date  . NSVD (normal spontaneous vaginal delivery)   . Diabetes mellitus     GESTATIONAL  . HSV-2 (herpes simplex virus 2) infection   . Depression   . Anxiety   . ADD (attention deficit disorder with hyperactivity)   . Hypertension   . Systolic murmur     Echo w/EF =>55% year 2008  . Tachycardia   . SOB (shortness of breath)   . Palpitations   . RBBB (right bundle branch block)     w/ leftward axis & borderline LAFB    Past Surgical History  Procedure Laterality Date  . Cervical biopsy  w/ loop electrode excision  01/2001    MARGINS FREE CIN 2  . Laparoscopic cholecystectomy    . Mouth surgery       Current Outpatient Prescriptions  Medication Sig Dispense Refill  . Multiple Vitamin (MULTIVITAMIN) tablet Take 1 tablet by mouth daily. When she remembers.     No current facility-administered medications for this visit.    Allergies:   Review of patient's allergies indicates no known allergies.    Social History:  The patient  reports that she has never smoked. She has never used smokeless tobacco. She reports that she does not drink alcohol or use illicit drugs.   Family History:  The patient's family history includes Cancer in  her mother; Diabetes in her maternal grandfather and mother; Hyperlipidemia in her father, maternal grandfather, and mother; Hypertension in her father and paternal grandmother.    ROS:  Please see the history of present illness.   Otherwise, review of systems are positive for none.   All other systems are reviewed and negative.    PHYSICAL EXAM: VS:  BP 142/94 mmHg  Pulse 87  Ht 5\' 2"  (1.575 m)  Wt 194 lb (87.998 kg)  BMI 35.47 kg/m2 , BMI Body mass index is 35.47 kg/(m^2). GENERAL:  Well appearing HEENT:  Pupils equal round and reactive, fundi not visualized, oral mucosa unremarkable NECK:  No jugular venous distention, waveform within normal limits, carotid  upstroke brisk and symmetric, no bruits, no thyromegaly LYMPHATICS:  No cervical adenopathy LUNGS:  Clear to auscultation bilaterally HEART:  RRR.  PMI not displaced or sustained,S1 and S2 within normal limits, no S3, no S4, no clicks, no rubs, no murmurs ABD:  Flat, positive bowel sounds normal in frequency in pitch, no bruits, no rebound, no guarding, no midline pulsatile mass, no hepatomegaly, no splenomegaly EXT:  2 plus pulses throughout, no edema, no cyanosis no clubbing SKIN:  No rashes no nodules NEURO:  Cranial nerves II through XII grossly intact, motor grossly intact throughout PSYCH:  Cognitively intact, oriented to person place and time    EKG:  EKG is ordered today. The ekg ordered today demonstrates sinus rhythm. Rate 87 bpm. Left axis deviation. Poor R-wave progression. Low voltage precordial leads.  Echo 10/17/06: LVEF greater than 55%. Trace tricuspid regurgitation.  Recent Labs: 04/14/2015: ALT 15; BUN 11; Creat 0.97; Hemoglobin 14.0; Platelets 387; Potassium 4.1; Sodium 141; TSH 2.099    Lipid Panel    Component Value Date/Time   CHOL 181 04/14/2015 0844   TRIG 261* 04/14/2015 0844   HDL 28* 04/14/2015 0844   CHOLHDL 6.5* 04/14/2015 0844   VLDL 52* 04/14/2015 0844   LDLCALC 101 04/14/2015 0844      Wt Readings from Last 3 Encounters:  01/23/16 194 lb (87.998 kg)  03/11/15 184 lb (83.462 kg)  02/07/15 184 lb (83.462 kg)      ASSESSMENT AND PLAN:  # Palpitations: Symptoms are likely due to PACs or PVCs. We will obtain a 14 day event monitor to better evaluate. She already had a CBC, CMP, thyroid function studies within the last year that were unremarkable. She also reports that these were done with her PCP recently and there were no significant abnormalities.   # Hypertension: Megan Pace Blood pressure has been mildly elevated. However, we will wait until the results of her event monitor prior to starting an antihypertensive.Now  # Shortness of  breath: # LE edema:  Megan Pace reports lower extremity edema, orthopnea, and shortness of breath with exertion. We will obtain an echocardiogram to evaluate for heart failure. She does not appear to have heart failure on exam.  # Obesity: We discussed the importance of exercising at least 30 minutes most days of the week and improving her diet. She expressed understanding.    Current medicines are reviewed at length with the patient today.  The patient does not have concerns regarding medicines.  The following changes have been made:  no change  Labs/ tests ordered today include:    No orders of the defined types were placed in this encounter.     Disposition:   FU with Gurvir Schrom C. Oval Linsey, MD, [facility] in 6 weeks.     This note was  written with the assistance of speech recognition software.  Please excuse any transcriptional errors.  Signed, Ytzel Gubler C. Oval Linsey, MD, [facility]  01/23/2016 9:54 AM    [facility]

## 2016-01-23 NOTE — Patient Instructions (Addendum)
Medication Instructions:  Your physician recommends that you continue on your current medications as directed. Please refer to the Current Medication list given to you today.  Labwork: NONE  Testing/Procedures: Your physician has requested that you have an echocardiogram. Echocardiography is a painless test that uses sound waves to create images of your heart. It provides your doctor with information about the size and shape of your heart and how well your heart's chambers and valves are working. This procedure takes approximately one hour. There are no restrictions for this procedure.  Your physician has recommended that you wear an event monitor. Event monitors are medical devices that record the heart's electrical activity. Doctors most often [place] these monitors to diagnose arrhythmias. Arrhythmias are problems with the speed or rhythm of the heartbeat. The monitor is a small, portable device. You can wear one while you do your normal daily activities. This is usually used to diagnose what is causing palpitations/syncope (passing out). 14 days  Follow-Up: Your physician recommends that you schedule a follow-up appointment in: 6 WEEKS   If you need a refill on your cardiac medications before your next appointment, please call your pharmacy.

## 2016-01-24 ENCOUNTER — Encounter: Payer: Self-pay | Admitting: Cardiovascular Disease

## 2016-01-24 DIAGNOSIS — I493 Ventricular premature depolarization: Secondary | ICD-10-CM | POA: Insufficient documentation

## 2016-01-24 DIAGNOSIS — E669 Obesity, unspecified: Secondary | ICD-10-CM

## 2016-01-24 DIAGNOSIS — I10 Essential (primary) hypertension: Secondary | ICD-10-CM

## 2016-01-24 HISTORY — DX: Obesity, unspecified: E66.9

## 2016-01-24 HISTORY — DX: Essential (primary) hypertension: I10

## 2016-02-05 DIAGNOSIS — R002 Palpitations: Secondary | ICD-10-CM | POA: Diagnosis not present

## 2016-02-16 ENCOUNTER — Ambulatory Visit (HOSPITAL_COMMUNITY): Payer: Managed Care, Other (non HMO) | Attending: Internal Medicine

## 2016-02-16 ENCOUNTER — Other Ambulatory Visit: Payer: Self-pay

## 2016-02-16 DIAGNOSIS — I340 Nonrheumatic mitral (valve) insufficiency: Secondary | ICD-10-CM | POA: Diagnosis not present

## 2016-02-16 DIAGNOSIS — R0602 Shortness of breath: Secondary | ICD-10-CM | POA: Diagnosis not present

## 2016-02-16 DIAGNOSIS — R600 Localized edema: Secondary | ICD-10-CM

## 2016-02-16 DIAGNOSIS — R002 Palpitations: Secondary | ICD-10-CM | POA: Diagnosis not present

## 2016-02-16 LAB — ECHOCARDIOGRAM COMPLETE
CHL CUP MV DEC (S): 92
CHL CUP TV REG PEAK VELOCITY: 121 cm/s
E decel time: 92 msec
E/e' ratio: 7.5
FS: 26 % — AB (ref 28–44)
IVS/LV PW RATIO, ED: 1.07
LA ID, A-P, ES: 38 mm
LA diam end sys: 38 mm
LA diam index: 2.01 cm/m2
LA vol A4C: 33 ml
LA vol index: 19 mL/m2
LAVOL: 36 mL
LV E/e' medial: 7.5
LVEEAVG: 7.5
LVELAT: 10.4 cm/s
LVOT VTI: 20.2 cm
LVOT area: 3.14 cm2
LVOT diameter: 20 mm
LVOT peak vel: 105 cm/s
LVOTSV: 63 mL
MV pk A vel: 79 m/s
MVPG: 2 mmHg
MVPKEVEL: 78 m/s
PW: 11.3 mm — AB (ref 0.6–1.1)
TDI e' lateral: 10.4
TDI e' medial: 9.32
TR max vel: 121 cm/s

## 2016-02-18 ENCOUNTER — Telehealth: Payer: Self-pay | Admitting: *Deleted

## 2016-02-18 NOTE — Telephone Encounter (Signed)
Left message to call back  

## 2016-02-18 NOTE — Telephone Encounter (Signed)
-----   Message from Skeet Latch, MD sent at 02/16/2016  3:57 PM EDT ----- Monitor showed sinus rhythm and sinus tachycardia when she noted heart racing and chest pain.  These are normal rhythms but can cause her to feel like her heart is racing.  Given that her BP is also mildly elevated, start metoprolol tartrate 12.5 mg bid.  Follow up in 1 month.

## 2016-02-19 MED ORDER — METOPROLOL TARTRATE 25 MG PO TABS: 12.5000 mg | ORAL_TABLET | Freq: Two times a day (BID) | ORAL | Status: DC

## 2016-02-19 NOTE — Telephone Encounter (Signed)
Patient called with monitor results. Voiced understanding and agreed w/MD plan of starting B-blocker.  Rx(s) sent to pharmacy electronically. Patient has MD appt already scheduled for 03/11/16  Echo results not available at this time

## 2016-02-19 NOTE — Telephone Encounter (Signed)
Pt returned call to [facility] regarding Echo results

## 2016-02-23 ENCOUNTER — Telehealth: Payer: Self-pay | Admitting: *Deleted

## 2016-02-23 NOTE — Telephone Encounter (Signed)
-----   Message from Skeet Latch, MD sent at 02/22/2016 11:19 PM EDT ----- Echo shows that her heart squeezes well but does not relax completely.  This is a mild change and will not cause symptoms unless it worsens.  It will be important to keep her blood pressure under good control.

## 2016-02-23 NOTE — Telephone Encounter (Signed)
Left message to call back in regards to echo

## 2016-03-11 ENCOUNTER — Ambulatory Visit: Payer: Managed Care, Other (non HMO) | Admitting: Cardiovascular Disease

## 2016-04-14 ENCOUNTER — Ambulatory Visit (INDEPENDENT_AMBULATORY_CARE_PROVIDER_SITE_OTHER): Payer: Managed Care, Other (non HMO) | Admitting: Cardiovascular Disease

## 2016-04-14 ENCOUNTER — Encounter: Payer: Self-pay | Admitting: Cardiovascular Disease

## 2016-04-14 ENCOUNTER — Encounter (INDEPENDENT_AMBULATORY_CARE_PROVIDER_SITE_OTHER): Payer: Self-pay

## 2016-04-14 VITALS — BP 134/88 | HR 99 | Ht 62.0 in | Wt 196.8 lb

## 2016-04-14 DIAGNOSIS — I10 Essential (primary) hypertension: Secondary | ICD-10-CM | POA: Diagnosis not present

## 2016-04-14 DIAGNOSIS — I493 Ventricular premature depolarization: Secondary | ICD-10-CM | POA: Diagnosis not present

## 2016-04-14 DIAGNOSIS — E669 Obesity, unspecified: Secondary | ICD-10-CM

## 2016-04-14 NOTE — Patient Instructions (Signed)
Your physician wants you to follow-up in: 1 year or sooner if needed. You will receive a reminder letter in the mail two months in advance. If you don't receive a letter, please call our office to schedule the follow-up appointment.   If you need a refill on your cardiac medications before your next appointment, please call your pharmacy.   

## 2016-04-14 NOTE — Progress Notes (Signed)
Cardiology Office Note   Date:  04/14/2016   ID:  Megan Pace, DOB [birth year], MRN [id number]  PCP:  Tawanna Solo, MD  Cardiologist:   Skeet Latch, MD   No chief complaint on file.     History of Present Illness: Megan Pace is a 40 y.o. female with hypertension, depression, anxiety, OSA on CPAP, and DM who presents for follow up on palpitations.  Ms. Megan Pace was evaluated 01/2016 for palpitations.  She had a 7 day event monitor that showed symptomatic PVCs.  She was started on metoprolol 12.5 mg bid.  Since starting metoprolol she has been feeling much better.  She has very occasional palpitations but much less than in the past.  At the last appointment she also reported some shortness of breath and lower extremity edema.  She had an echo that revealed LVEF 65-70% and grade 1 diastolic dysfunction.  She has not noted any significant changes in her symptoms.  She has started trying to exercise some but knows that she needs to do more.   Past Medical History:  Diagnosis Date  . ADD (attention deficit disorder with hyperactivity)   . Anxiety   . Depression   . Diabetes mellitus    GESTATIONAL  . Essential hypertension 01/24/2016  . HSV-2 (herpes simplex virus 2) infection   . Hypertension   . NSVD (normal spontaneous vaginal delivery)   . Obesity (BMI 35.0-39.9 without comorbidity) ([place]) 01/24/2016  . Palpitations   . RBBB (right bundle branch block)    w/ leftward axis & borderline LAFB  . SOB (shortness of breath)   . Systolic murmur    Echo w/EF =>55% year 2008  . Tachycardia     Past Surgical History:  Procedure Laterality Date  . CERVICAL BIOPSY  W/ LOOP ELECTRODE EXCISION  01/2001   MARGINS FREE CIN 2  . LAPAROSCOPIC CHOLECYSTECTOMY    . MOUTH SURGERY       Current Outpatient Prescriptions  Medication Sig Dispense Refill  . metoprolol tartrate (LOPRESSOR) 25 MG tablet Take 0.5 tablets (12.5 mg total) by mouth 2 (two) times daily. 30 tablet 5  .  Multiple Vitamin (MULTIVITAMIN) tablet Take 1 tablet by mouth daily. When she remembers.     No current facility-administered medications for this visit.     Allergies:   Review of patient's allergies indicates no known allergies.    Social History:  The patient  reports that she has never smoked. She has never used smokeless tobacco. She reports that she does not drink alcohol or use drugs.   Family History:  The patient's family history includes Cancer in her mother; Diabetes in her maternal grandfather and mother; Heart disease in her father; Hyperlipidemia in her father, maternal grandfather, and mother; Hypertension in her father and paternal grandmother.    ROS:  Please see the history of present illness.   Otherwise, review of systems are positive for none.   All other systems are reviewed and negative.    PHYSICAL EXAM: VS:  BP 134/88   Pulse 99   Ht 5\' 2"  (1.575 m)   Wt 196 lb 12.8 oz (89.3 kg)   BMI 36.00 kg/m  , BMI Body mass index is 36 kg/m. GENERAL:  Well appearing HEENT:  Pupils equal round and reactive, fundi not visualized, oral mucosa unremarkable NECK:  No jugular venous distention, waveform within normal limits, carotid upstroke brisk and symmetric, no bruits, no thyromegaly LYMPHATICS:  No cervical adenopathy LUNGS:  Clear  to auscultation bilaterally HEART:  RRR.  PMI not displaced or sustained,S1 and S2 within normal limits, no S3, no S4, no clicks, no rubs, no murmurs ABD:  Flat, positive bowel sounds normal in frequency in pitch, no bruits, no rebound, no guarding, no midline pulsatile mass, no hepatomegaly, no splenomegaly EXT:  2 plus pulses throughout, no edema, no cyanosis no clubbing SKIN:  No rashes no nodules NEURO:  Cranial nerves II through XII grossly intact, motor grossly intact throughout PSYCH:  Cognitively intact, oriented to person place and time    EKG:  EKG is not ordered today. The ekg ordered 01/23/16 demonstrates sinus rhythm. Rate 87  bpm. Left axis deviation. Poor R-wave progression. Low voltage precordial leads.  Echo 10/17/06: LVEF greater than 55%. Trace tricuspid regurgitation.  14 Day Event Monitor 01/26/16:  Quality: Fair.  Baseline artifact. Predominant rhythm: sinus rhythm, sinus tachycardia  Multiple episodes of sinus rhythm and sinus tachycardia with reports of heart racing and chest pain. Occasional PVCs  Echo 02/16/16: Study Conclusions  - Left ventricle: The cavity size was normal. Systolic function was   vigorous. The estimated ejection fraction was in the range of 65%   to 70%. Doppler parameters are consistent with abnormal left   ventricular relaxation (grade 1 diastolic dysfunction). - Mitral valve: There was mild regurgitation.  Recent Labs: No results found for requested labs within last 8760 hours.    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  She continues to have some symptoms and we discussed the option of increasing the dose to 25mg .  However, she isn't bothered enough by them to do so.  If it increases in the future I told her that it would be OK to increase.  # Hypertension: BP well-controlled on metoprolol.  # Shortness of breath: # LE edema: No evidence of heart failure on exam and echo revealed normal systolic function with grade 1 diastolic dysfunction.  # Obesity: Reinforced he importance of exercising at least 30 minutes most days of the week and improving her diet. She expressed understanding.    Current medicines are reviewed at length with the patient today.  The  patient does not have concerns regarding medicines.  The following changes have been made:  no change  Labs/ tests ordered today include:    No orders of the defined types were placed in this encounter.    Disposition:   FU with Collin Rengel C. Oval Linsey, MD, [facility] in 1 year   This note was written with the assistance of speech recognition software.  Please excuse any transcriptional errors.  Signed, Ishan Sanroman C. Oval Linsey, MD, [facility]  04/14/2016 8:24 AM    [facility]

## 2016-07-09 ENCOUNTER — Ambulatory Visit
Admission: RE | Admit: 2016-07-09 | Discharge: 2016-07-09 | Disposition: A | Discharge status: Home / Self Care | Payer: Managed Care, Other (non HMO) | Source: Ambulatory Visit | Attending: Family Medicine | Admitting: Family Medicine

## 2016-07-09 ENCOUNTER — Other Ambulatory Visit: Payer: Self-pay | Admitting: Family Medicine

## 2016-07-09 DIAGNOSIS — R109 Unspecified abdominal pain: Secondary | ICD-10-CM

## 2016-10-11 ENCOUNTER — Encounter: Payer: Self-pay | Admitting: Gynecology

## 2016-10-11 ENCOUNTER — Ambulatory Visit (INDEPENDENT_AMBULATORY_CARE_PROVIDER_SITE_OTHER): Payer: Managed Care, Other (non HMO) | Admitting: Gynecology

## 2016-10-11 VITALS — BP 128/80 | Ht 62.0 in | Wt 189.0 lb

## 2016-10-11 DIAGNOSIS — Z01419 Encounter for gynecological examination (general) (routine) without abnormal findings: Secondary | ICD-10-CM

## 2016-10-11 LAB — COMPREHENSIVE METABOLIC PANEL
ALBUMIN: 4.2 g/dL (ref 3.6–5.1)
ALT: 17 U/L (ref 6–29)
AST: 14 U/L (ref 10–30)
Alkaline Phosphatase: 63 U/L (ref 33–115)
BILIRUBIN TOTAL: 0.3 mg/dL (ref 0.2–1.2)
BUN: 13 mg/dL (ref 7–25)
CALCIUM: 8.8 mg/dL (ref 8.6–10.2)
CO2: 24 mmol/L (ref 20–31)
CREATININE: 0.86 mg/dL (ref 0.50–1.10)
Chloride: 106 mmol/L (ref 98–110)
Glucose, Bld: 113 mg/dL — ABNORMAL HIGH (ref 65–99)
Potassium: 4.2 mmol/L (ref 3.5–5.3)
SODIUM: 138 mmol/L (ref 135–146)
TOTAL PROTEIN: 6.8 g/dL (ref 6.1–8.1)

## 2016-10-11 LAB — URINALYSIS W MICROSCOPIC + REFLEX CULTURE
Bilirubin Urine: NEGATIVE
CASTS: NONE SEEN [LPF]
CRYSTALS: NONE SEEN [HPF]
Glucose, UA: NEGATIVE
HGB URINE DIPSTICK: NEGATIVE
KETONES UR: NEGATIVE
Leukocytes, UA: NEGATIVE
Nitrite: NEGATIVE
PROTEIN: NEGATIVE
RBC / HPF: NONE SEEN RBC/HPF (ref <=2)
Specific Gravity, Urine: 1.025 (ref 1.001–1.035)
Yeast: NONE SEEN [HPF]
pH: 6 (ref 5.0–8.0)

## 2016-10-11 LAB — CBC WITH DIFFERENTIAL/PLATELET
Basophils Absolute: 0 cells/uL (ref 0–200)
Basophils Relative: 0 %
EOS PCT: 2 %
Eosinophils Absolute: 160 cells/uL (ref 15–500)
HEMATOCRIT: 41.5 % (ref 35.0–45.0)
HEMOGLOBIN: 14.2 g/dL (ref 11.7–15.5)
LYMPHS ABS: 3600 {cells}/uL (ref 850–3900)
Lymphocytes Relative: 45 %
MCH: 28.5 pg (ref 27.0–33.0)
MCHC: 34.2 g/dL (ref 32.0–36.0)
MCV: 83.3 fL (ref 80.0–100.0)
MONO ABS: 560 {cells}/uL (ref 200–950)
MPV: 8.8 fL (ref 7.5–12.5)
Monocytes Relative: 7 %
NEUTROS ABS: 3680 {cells}/uL (ref 1500–7800)
NEUTROS PCT: 46 %
Platelets: 383 10*3/uL (ref 140–400)
RBC: 4.98 MIL/uL (ref 3.80–5.10)
RDW: 13.9 % (ref 11.0–15.0)
WBC: 8 10*3/uL (ref 3.8–10.8)

## 2016-10-11 LAB — LIPID PANEL
Cholesterol: 176 mg/dL (ref <200)
HDL: 31 mg/dL — AB (ref >50)
LDL CALC: 90 mg/dL (ref <100)
TRIGLYCERIDES: 275 mg/dL — AB (ref <150)
Total CHOL/HDL Ratio: 5.7 Ratio — ABNORMAL HIGH (ref <5.0)
VLDL: 55 mg/dL — ABNORMAL HIGH (ref <30)

## 2016-10-11 LAB — TSH: TSH: 1.91 mIU/L

## 2016-10-11 NOTE — Progress Notes (Signed)
Megan Pace Mar 01, 1976 [id number]   History:    41 y.o.  for annual gyn exam with no complaints today. Patient had an IUD placed in July 2011 (Mirena). Back in 2002 patient had a LEEP cervical conization and margins were negative. Last Pap smear was normal in 2016. Patient is fasting today would like to have her to have her blood work drawn.   Past medical history,surgical history, family history and social history were all reviewed and documented in the EPIC chart.  Gynecologic History No LMP recorded. Patient is not currently having periods (Reason: IUD). Contraception: IUD Last Pap: 2016. Results were: normal Last mammogram: 2010. Results were: normal  Obstetric History OB History  Gravida Para Term Preterm AB Living  4 2 2   1 2   SAB TAB Ectopic Multiple Live Births  1       2    # Outcome Date GA Lbr Len/2nd Weight Sex Delivery Anes PTL Lv  4 SAB           3 Gravida           2 Term     F Vag-Spont  N LIV  1 Term     F Vag-Spont  N LIV       ROS: A ROS was performed and pertinent positives and negatives are included in the history.  GENERAL: No fevers or chills. HEENT: No change in vision, no earache, sore throat or sinus congestion. NECK: No pain or stiffness. CARDIOVASCULAR: No chest pain or pressure. No palpitations. PULMONARY: No shortness of breath, cough or wheeze. GASTROINTESTINAL: No abdominal pain, nausea, vomiting or diarrhea, melena or bright red blood per rectum. GENITOURINARY: No urinary frequency, urgency, hesitancy or dysuria. MUSCULOSKELETAL: No joint or muscle pain, no back pain, no recent trauma. DERMATOLOGIC: No rash, no itching, no lesions. ENDOCRINE: No polyuria, polydipsia, no heat or cold intolerance. No recent change in weight. HEMATOLOGICAL: No anemia or easy bruising or bleeding. NEUROLOGIC: No headache, seizures, numbness, tingling or weakness. PSYCHIATRIC: No depression, no loss of interest in normal activity or change in sleep pattern.      Exam: chaperone present  BP 128/80   Ht 5\' 2"  (1.575 m)   Wt 189 lb (85.7 kg)   BMI 34.57 kg/m   Body mass index is 34.57 kg/m.  General appearance : Well developed well nourished female. No acute distress HEENT: Eyes: no retinal hemorrhage or exudates,  Neck supple, trachea midline, no carotid bruits, no thyroidmegaly Lungs: Clear to auscultation, no rhonchi or wheezes, or rib retractions  Heart: Regular rate and rhythm, no murmurs or gallops Breast:Examined in sitting and supine position were symmetrical in appearance, no palpable masses or tenderness,  no skin retraction, no nipple inversion, no nipple discharge, no skin discoloration, no axillary or supraclavicular lymphadenopathy Abdomen: no palpable masses or tenderness, no rebound or guarding Extremities: no edema or skin discoloration or tenderness  Pelvic:  Bartholin, Urethra, Skene Glands: Within normal limits             Vagina: No gross lesions or discharge  Cervix: No gross lesions or discharge  Uterus  anteverted, normal size, shape and consistency, non-tender and mobile  Adnexa  Without masses or tenderness  Anus and perineum  normal   Rectovaginal  normal sphincter tone without palpated masses or tenderness             Hemoccult not indicated     Assessment/Plan:  41 y.o. female for annual exam  will have the following screening fasting blood work: Comprehensive metabolic panel, fasting lipid profile, TSH, CBC, and urinalysis. Pap smear with HPV screening done today. Patient was provided with a requisition to schedule her overdue mammogram.  Terrance Mass MD, 8:59 AM 10/11/2016

## 2016-10-12 ENCOUNTER — Encounter: Payer: Self-pay | Admitting: Gynecology

## 2016-10-12 LAB — URINE CULTURE: ORGANISM ID, BACTERIA: NO GROWTH

## 2016-10-13 LAB — PAP, TP IMAGING W/ HPV RNA, RFLX HPV TYPE 16,18/45: HPV mRNA, High Risk: NOT DETECTED

## 2017-01-05 ENCOUNTER — Encounter: Payer: Self-pay | Admitting: Gynecology

## 2017-02-19 DIAGNOSIS — R05 Cough: Secondary | ICD-10-CM | POA: Diagnosis not present

## 2017-06-02 DIAGNOSIS — R05 Cough: Secondary | ICD-10-CM | POA: Diagnosis not present

## 2017-07-13 DIAGNOSIS — K219 Gastro-esophageal reflux disease without esophagitis: Secondary | ICD-10-CM | POA: Diagnosis not present

## 2017-10-12 ENCOUNTER — Encounter: Payer: Managed Care, Other (non HMO) | Admitting: Obstetrics & Gynecology

## 2018-05-11 DIAGNOSIS — R7303 Prediabetes: Secondary | ICD-10-CM | POA: Diagnosis not present

## 2018-05-11 DIAGNOSIS — E785 Hyperlipidemia, unspecified: Secondary | ICD-10-CM | POA: Diagnosis not present

## 2018-05-15 DIAGNOSIS — L820 Inflamed seborrheic keratosis: Secondary | ICD-10-CM | POA: Diagnosis not present

## 2018-05-15 DIAGNOSIS — L821 Other seborrheic keratosis: Secondary | ICD-10-CM | POA: Diagnosis not present

## 2018-05-15 DIAGNOSIS — D1801 Hemangioma of skin and subcutaneous tissue: Secondary | ICD-10-CM | POA: Diagnosis not present

## 2018-05-15 DIAGNOSIS — D229 Melanocytic nevi, unspecified: Secondary | ICD-10-CM | POA: Diagnosis not present

## 2018-06-26 DIAGNOSIS — Z23 Encounter for immunization: Secondary | ICD-10-CM | POA: Diagnosis not present

## 2018-07-04 DIAGNOSIS — J0100 Acute maxillary sinusitis, unspecified: Secondary | ICD-10-CM | POA: Diagnosis not present

## 2018-07-07 ENCOUNTER — Other Ambulatory Visit: Payer: Self-pay | Admitting: Women's Health

## 2018-07-07 DIAGNOSIS — Z1231 Encounter for screening mammogram for malignant neoplasm of breast: Secondary | ICD-10-CM

## 2018-07-10 ENCOUNTER — Ambulatory Visit: Payer: Managed Care, Other (non HMO)

## 2018-07-24 ENCOUNTER — Ambulatory Visit
Admission: RE | Admit: 2018-07-24 | Discharge: 2018-07-24 | Disposition: A | Discharge status: Home / Self Care | Payer: 59 | Source: Ambulatory Visit | Attending: Women's Health | Admitting: Women's Health

## 2018-07-24 DIAGNOSIS — Z1231 Encounter for screening mammogram for malignant neoplasm of breast: Secondary | ICD-10-CM | POA: Diagnosis not present

## 2018-07-26 ENCOUNTER — Encounter: Payer: Self-pay | Admitting: Women's Health

## 2018-07-26 ENCOUNTER — Ambulatory Visit: Payer: 59 | Admitting: Women's Health

## 2018-07-26 VITALS — BP 120/82 | Ht 62.0 in | Wt 173.0 lb

## 2018-07-26 DIAGNOSIS — Z01419 Encounter for gynecological examination (general) (routine) without abnormal findings: Secondary | ICD-10-CM | POA: Diagnosis not present

## 2018-07-26 NOTE — Progress Notes (Signed)
Megan Pace [birth year] [id number]    History: 42 yo DWF, presents for annual exam. Amenorrheic, Mirena IUD 2016. Normal pap and mammogram history. Hypertension, labs and meds managed by primary care.  Lost 15 pounds with diet.  Past medical history, past surgical history, family history and social history were all reviewed and documented in the EPIC chart. 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 Two daughters ages 6 and 47 both doing well. Father not involved     ROS:  A ROS was performed and pertinent positives and negatives are included.  Exam:  Vitals:   07/26/18 1553  BP: 120/82  Weight: 173 lb (78.5 kg)  Height: 5\' 2"  (1.575 m)   Body mass index is 31.64 kg/m.   General appearance:  Normal Thyroid:  Symmetrical, normal in size, without palpable masses or nodularity. Respiratory  Auscultation:  Clear without wheezing or rhonchi Cardiovascular  Auscultation:  Regular rate, without rubs, murmurs or gallops  Edema/varicosities:  Not grossly evident Abdominal  Soft,nontender, without masses, guarding or rebound.  Liver/spleen:  No organomegaly noted  Hernia:  None appreciated  Skin  Inspection:  Grossly normal   Breasts: Examined lying and sitting.     Right: Without masses, retractions, discharge or axillary adenopathy.     Left: Without masses, retractions, discharge or axillary adenopathy. Gentitourinary   Inguinal/mons:  Normal without inguinal adenopathy  External genitalia:  Normal  BUS/Urethra/Skene's glands:  Normal  Vagina:  Normal  Cervix:  Normal  Uterus:  normal in size, shape and contour.  Midline and mobile  Adnexa/parametria:     Rt: Without masses or tenderness.   Lt: Without masses or tenderness.  Anus and perineum: Normal  Digital rectal exam: Normal sphincter tone without palpated masses or tenderness  Assessment:  42 y.o. G4P2 DWF present  for annual exam with no complaints.    02/2015 Mirena IUD/  Amenorrheic  Hypertension and depression - medication and labs managed by primary care Obesity  Plan: Reviewed importance of high calcium rich foods, Vitamin D 1000 daily, and exercise. Encouraged leisure activities. SBE's annual screening mammogram, congratulated on weight loss continue to eat low calorie/carb diet.  Pap normal 2018, new screening guidelines reviewed.      [place], 4:04 PM 07/26/2018

## 2018-07-26 NOTE — Patient Instructions (Addendum)
Vit d 1000 daily Health Maintenance, Female Adopting a healthy lifestyle and getting preventive care can go a long way to promote health and wellness. Talk with your health care provider about what schedule of regular examinations is right for you. This is a good chance for you to check in with your provider about disease prevention and staying healthy. In between checkups, there are plenty of things you can do on your own. Experts have done a lot of research about which lifestyle changes and preventive measures are most likely to keep you healthy. Ask your health care provider for more information. Weight and diet Eat a healthy diet  Be sure to include plenty of vegetables, fruits, low-fat dairy products, and lean protein.  Do not eat a lot of foods high in solid fats, added sugars, or salt.  Get regular exercise. This is one of the most important things you can do for your health. ? Most adults should exercise for at least 150 minutes each week. The exercise should increase your heart rate and make you sweat (moderate-intensity exercise). ? Most adults should also do strengthening exercises at least twice a week. This is in addition to the moderate-intensity exercise.  Maintain a healthy weight  Body mass index (BMI) is a measurement that can be used to identify possible weight problems. It estimates body fat based on height and weight. Your health care provider can help determine your BMI and help you achieve or maintain a healthy weight.  For females 32 years of age and older: ? A BMI below 18.5 is considered underweight. ? A BMI of 18.5 to 24.9 is normal. ? A BMI of 25 to 29.9 is considered overweight. ? A BMI of 30 and above is considered obese.  Watch levels of cholesterol and blood lipids  You should start having your blood tested for lipids and cholesterol at 44 years of age, then have this test every 5 years.  You may need to have your cholesterol levels checked more often  if: ? Your lipid or cholesterol levels are high. ? You are older than 44 years of age. ? You are at high risk for heart disease.  Cancer screening Lung Cancer  Lung cancer screening is recommended for adults 55-82 years old who are at high risk for lung cancer because of a history of smoking.  A yearly low-dose CT scan of the lungs is recommended for people who: ? Currently smoke. ? Have quit within the past 15 years. ? Have at least a 30-pack-year history of smoking. A pack year is smoking an average of one pack of cigarettes a day for 1 year.  Yearly screening should continue until it has been 15 years since you quit.  Yearly screening should stop if you develop a health problem that would prevent you from having lung cancer treatment.  Breast Cancer  Practice breast self-awareness. This means understanding how your breasts normally appear and feel.  It also means doing regular breast self-exams. Let your health care provider know about any changes, no matter how small.  If you are in your 20s or 30s, you should have a clinical breast exam (CBE) by a health care provider every 1-3 years as part of a regular health exam.  If you are 41 or older, have a CBE every year. Also consider having a breast X-ray (mammogram) every year.  If you have a family history of breast cancer, talk to your health care provider about genetic screening.  If you  are at high risk for breast cancer, talk to your health care provider about having an MRI and a mammogram every year.  Breast cancer gene (BRCA) assessment is recommended for women who have family members with BRCA-related cancers. BRCA-related cancers include: ? Breast. ? Ovarian. ? Tubal. ? Peritoneal cancers.  Results of the assessment will determine the need for genetic counseling and BRCA1 and BRCA2 testing.  Cervical Cancer Your health care provider may recommend that you be screened regularly for cancer of the pelvic organs  (ovaries, uterus, and vagina). This screening involves a pelvic examination, including checking for microscopic changes to the surface of your cervix (Pap test). You may be encouraged to have this screening done every 3 years, beginning at age 72.  For women ages 67-65, health care providers may recommend pelvic exams and Pap testing every 3 years, or they may recommend the Pap and pelvic exam, combined with testing for human papilloma virus (HPV), every 5 years. Some types of HPV increase your risk of cervical cancer. Testing for HPV may also be done on women of any age with unclear Pap test results.  Other health care providers may not recommend any screening for nonpregnant women who are considered low risk for pelvic cancer and who do not have symptoms. Ask your health care provider if a screening pelvic exam is right for you.  If you have had past treatment for cervical cancer or a condition that could lead to cancer, you need Pap tests and screening for cancer for at least 20 years after your treatment. If Pap tests have been discontinued, your risk factors (such as having a new sexual partner) need to be reassessed to determine if screening should resume. Some women have medical problems that increase the chance of getting cervical cancer. In these cases, your health care provider may recommend more frequent screening and Pap tests.  Colorectal Cancer  This type of cancer can be detected and often prevented.  Routine colorectal cancer screening usually begins at 44 years of age and continues through 44 years of age.  Your health care provider may recommend screening at an earlier age if you have risk factors for colon cancer.  Your health care provider may also recommend using home test kits to check for hidden blood in the stool.  A small camera at the end of a tube can be used to examine your colon directly (sigmoidoscopy or colonoscopy). This is done to check for the earliest forms of  colorectal cancer.  Routine screening usually begins at age 74.  Direct examination of the colon should be repeated every 5-10 years through 44 years of age. However, you may need to be screened more often if early forms of precancerous polyps or small growths are found.  Skin Cancer  Check your skin from head to toe regularly.  Tell your health care provider about any new moles or changes in moles, especially if there is a change in a mole's shape or color.  Also tell your health care provider if you have a mole that is larger than the size of a pencil eraser.  Always use sunscreen. Apply sunscreen liberally and repeatedly throughout the day.  Protect yourself by wearing long sleeves, pants, a wide-brimmed hat, and sunglasses whenever you are outside.  Heart disease, diabetes, and high blood pressure  High blood pressure causes heart disease and increases the risk of stroke. High blood pressure is more likely to develop in: ? People who have blood pressure in  the high end of the normal range (130-139/85-89 mm Hg). ? People who are overweight or obese. ? People who are African American.  If you are 18-39 years of age, have your blood pressure checked every 3-5 years. If you are 40 years of age or older, have your blood pressure checked every year. You should have your blood pressure measured twice-once when you are at a hospital or clinic, and once when you are not at a hospital or clinic. Record the average of the two measurements. To check your blood pressure when you are not at a hospital or clinic, you can use: ? An automated blood pressure machine at a pharmacy. ? A home blood pressure monitor.  If you are between 55 years and 79 years old, ask your health care provider if you should take aspirin to prevent strokes.  Have regular diabetes screenings. This involves taking a blood sample to check your fasting blood sugar level. ? If you are at a normal weight and have a low risk  for diabetes, have this test once every three years after 45 years of age. ? If you are overweight and have a high risk for diabetes, consider being tested at a younger age or more often. Preventing infection Hepatitis B  If you have a higher risk for hepatitis B, you should be screened for this virus. You are considered at high risk for hepatitis B if: ? You were born in a country where hepatitis B is common. Ask your health care provider which countries are considered high risk. ? Your parents were born in a high-risk country, and you have not been immunized against hepatitis B (hepatitis B vaccine). ? You have HIV or AIDS. ? You use needles to inject street drugs. ? You live with someone who has hepatitis B. ? You have had sex with someone who has hepatitis B. ? You get hemodialysis treatment. ? You take certain medicines for conditions, including cancer, organ transplantation, and autoimmune conditions.  Hepatitis C  Blood testing is recommended for: ? Everyone born from 1945 through 1965. ? Anyone with known risk factors for hepatitis C.  Sexually transmitted infections (STIs)  You should be screened for sexually transmitted infections (STIs) including gonorrhea and chlamydia if: ? You are sexually active and are younger than 44 years of age. ? You are older than 44 years of age and your health care provider tells you that you are at risk for this type of infection. ? Your sexual activity has changed since you were last screened and you are at an increased risk for chlamydia or gonorrhea. Ask your health care provider if you are at risk.  If you do not have HIV, but are at risk, it may be recommended that you take a prescription medicine daily to prevent HIV infection. This is called pre-exposure prophylaxis (PrEP). You are considered at risk if: ? You are sexually active and do not regularly use condoms or know the HIV status of your partner(s). ? You take drugs by  injection. ? You are sexually active with a partner who has HIV.  Talk with your health care provider about whether you are at high risk of being infected with HIV. If you choose to begin PrEP, you should first be tested for HIV. You should then be tested every 3 months for as long as you are taking PrEP. Pregnancy  If you are premenopausal and you may become pregnant, ask your health care provider about preconception counseling.    If you may become pregnant, take 400 to 800 micrograms (mcg) of folic acid every day.  If you want to prevent pregnancy, talk to your health care provider about birth control (contraception). Osteoporosis and menopause  Osteoporosis is a disease in which the bones lose minerals and strength with aging. This can result in serious bone fractures. Your risk for osteoporosis can be identified using a bone density scan.  If you are 17 years of age or older, or if you are at risk for osteoporosis and fractures, ask your health care provider if you should be screened.  Ask your health care provider whether you should take a calcium or vitamin D supplement to lower your risk for osteoporosis.  Menopause may have certain physical symptoms and risks.  Hormone replacement therapy may reduce some of these symptoms and risks. Talk to your health care provider about whether hormone replacement therapy is right for you. Follow these instructions at home:  Schedule regular health, dental, and eye exams.  Stay current with your immunizations.  Do not use any tobacco products including cigarettes, chewing tobacco, or electronic cigarettes.  If you are pregnant, do not drink alcohol.  If you are breastfeeding, limit how much and how often you drink alcohol.  Limit alcohol intake to no more than 1 drink per day for nonpregnant women. One drink equals 12 ounces of beer, 5 ounces of wine, or 1 ounces of hard liquor.  Do not use street drugs.  Do not share needles.  Ask  your health care provider for help if you need support or information about quitting drugs.  Tell your health care provider if you often feel depressed.  Tell your health care provider if you have ever been abused or do not feel safe at home. This information is not intended to replace advice given to you by your health care provider. Make sure you discuss any questions you have with your health care provider. Document Released: 02/22/2011 Document Revised: 01/15/2016 Document Reviewed: 05/13/2015 Elsevier Interactive Patient Education  [employer].

## 2018-07-28 LAB — URINALYSIS, COMPLETE W/RFL CULTURE
BACTERIA UA: NONE SEEN /HPF
Bilirubin Urine: NEGATIVE
GLUCOSE, UA: NEGATIVE
HGB URINE DIPSTICK: NEGATIVE
Hyaline Cast: NONE SEEN /LPF
KETONES UR: NEGATIVE
Nitrites, Initial: NEGATIVE
Protein, ur: NEGATIVE
RBC / HPF: NONE SEEN /HPF (ref 0–2)
Specific Gravity, Urine: 1.009 (ref 1.001–1.03)
Squamous Epithelial / LPF: NONE SEEN /HPF (ref <=5)
WBC UA: NONE SEEN /HPF (ref 0–5)
pH: 6.5 (ref 5.0–8.0)

## 2018-07-28 LAB — URINE CULTURE
MICRO NUMBER:: 91457453
Result:: NO GROWTH
SPECIMEN QUALITY:: ADEQUATE

## 2018-07-28 LAB — CULTURE INDICATED

## 2018-11-17 DIAGNOSIS — R7303 Prediabetes: Secondary | ICD-10-CM | POA: Diagnosis not present

## 2018-11-17 DIAGNOSIS — I10 Essential (primary) hypertension: Secondary | ICD-10-CM | POA: Diagnosis not present

## 2018-11-17 DIAGNOSIS — G4733 Obstructive sleep apnea (adult) (pediatric): Secondary | ICD-10-CM | POA: Diagnosis not present

## 2019-01-23 IMAGING — MG DIGITAL SCREENING BILATERAL MAMMOGRAM WITH TOMO AND CAD
8 series · 9 of 24 positions shown · non-contrast
Comparison: Previous exam(s).

CLINICAL DATA: Screening.

EXAM:
DIGITAL SCREENING BILATERAL MAMMOGRAM WITH TOMO AND CAD

[L CC synth-2D]
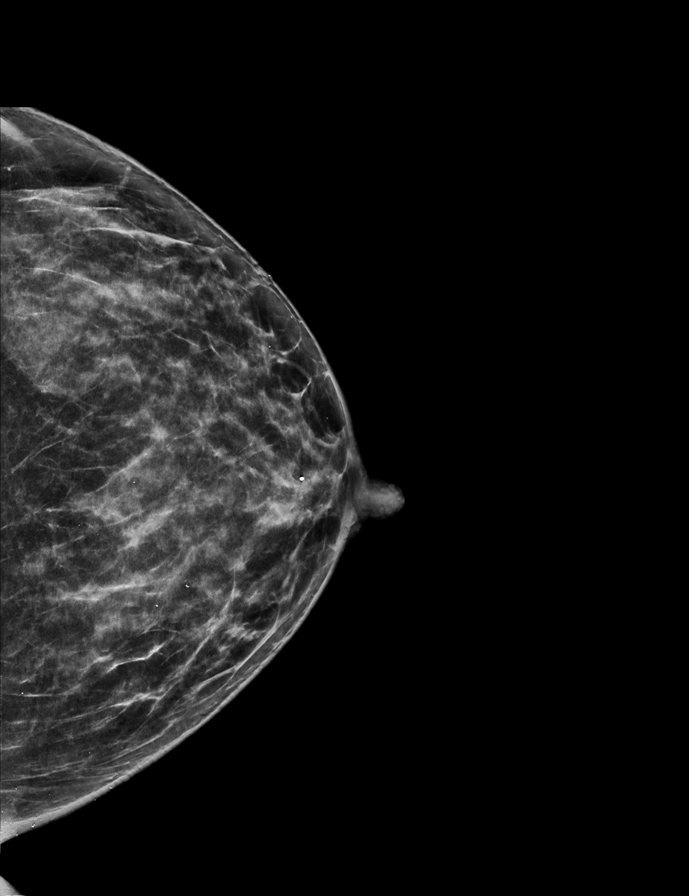

[L MLO synth-2D]
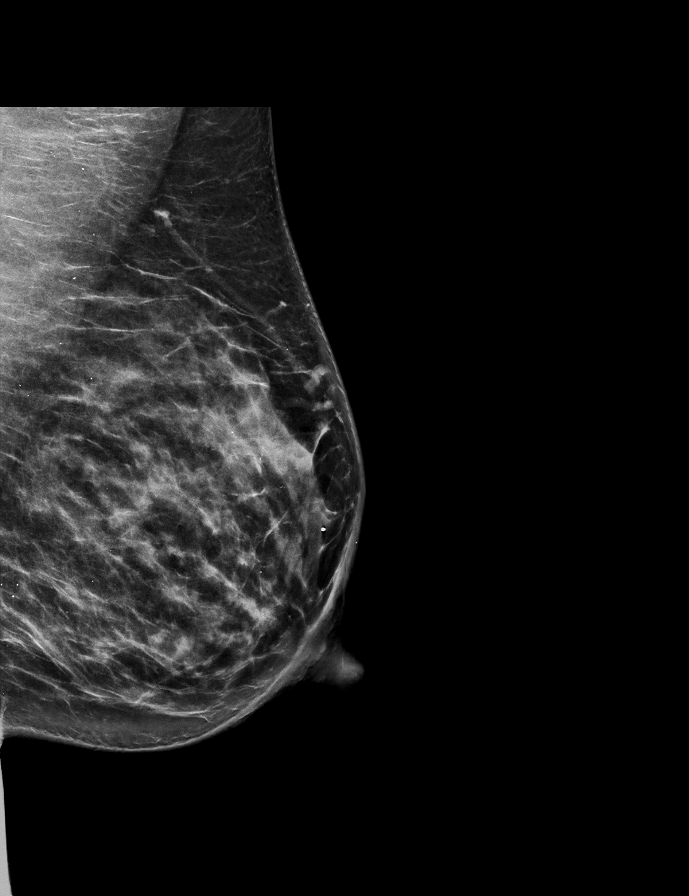

[R CC synth-2D]
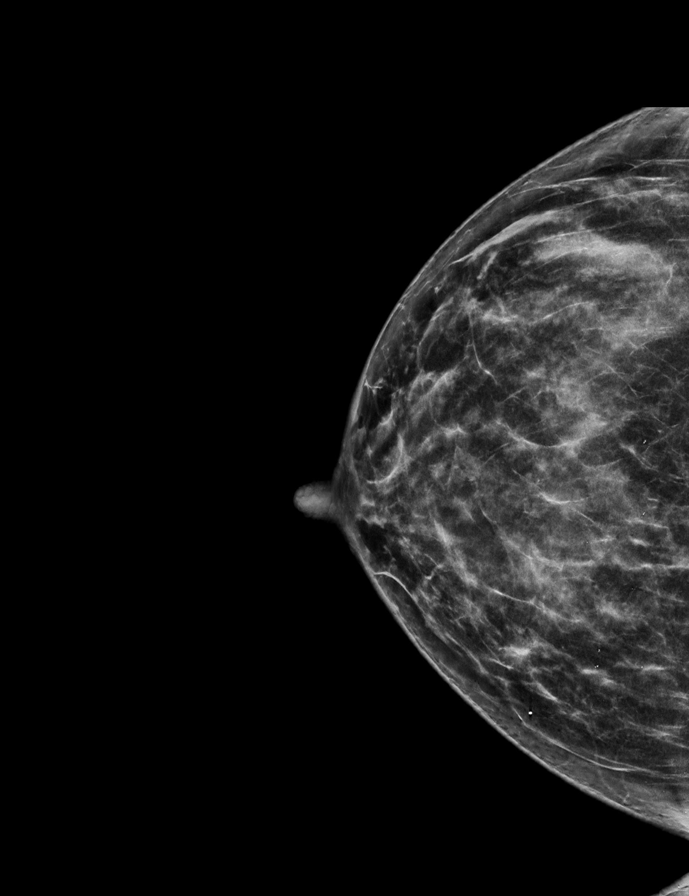

[R MLO synth-2D]
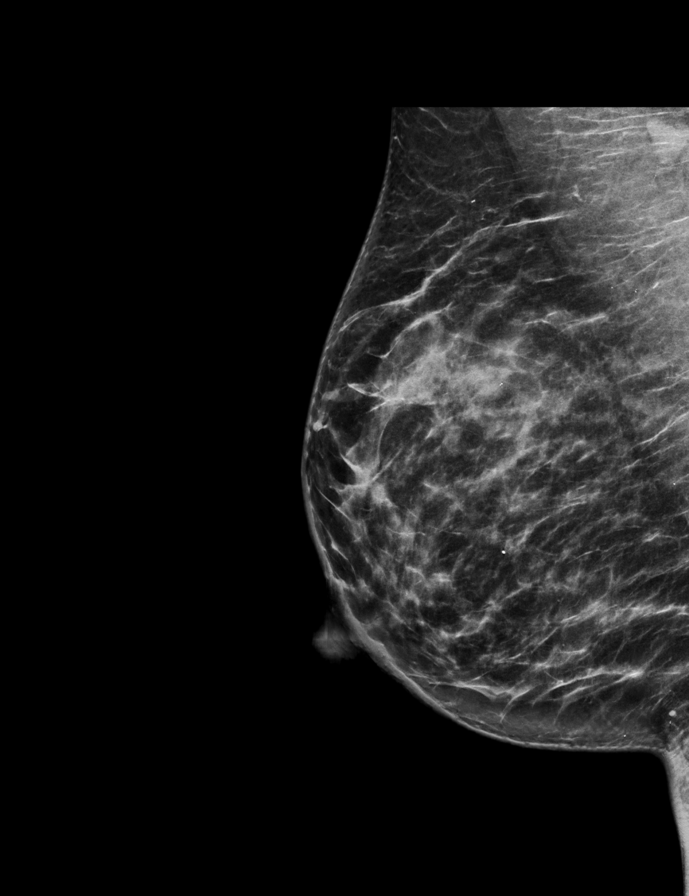

[L CC tomo · 2 of 62 frames shown]
[frame 21/62]
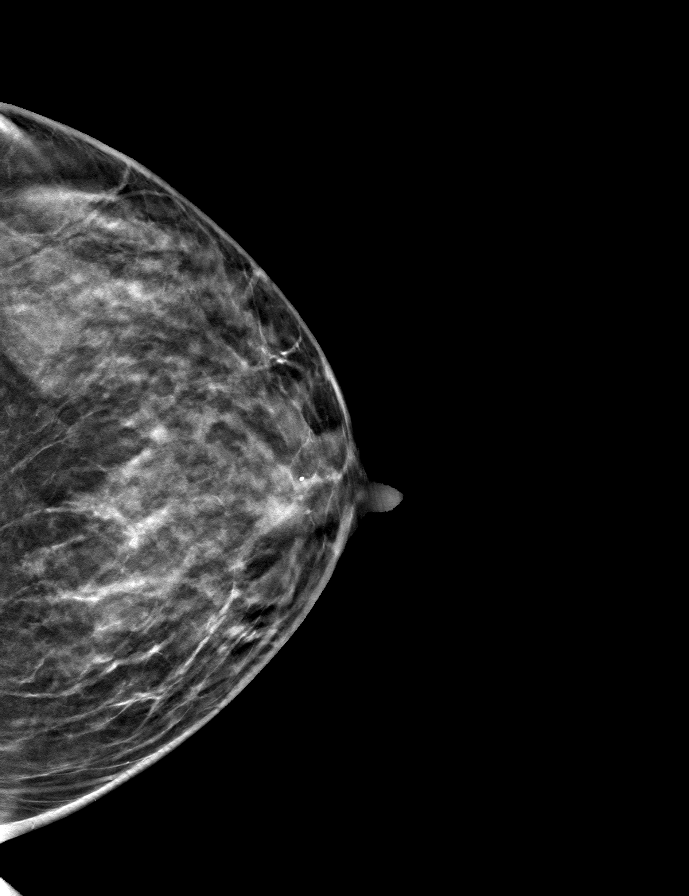
[frame 31/62]
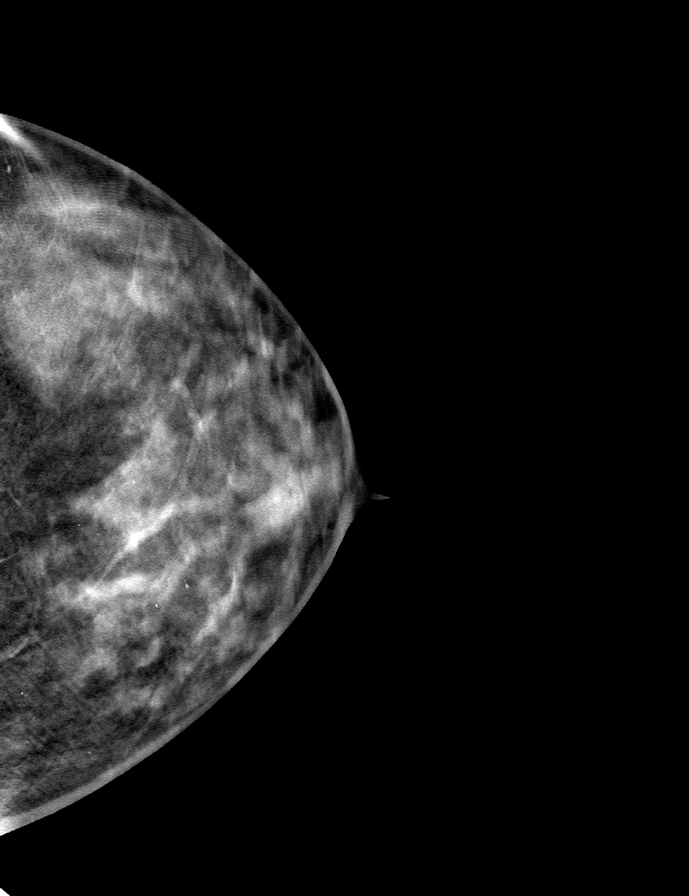

[R CC tomo · tomo slice 31/61.0]
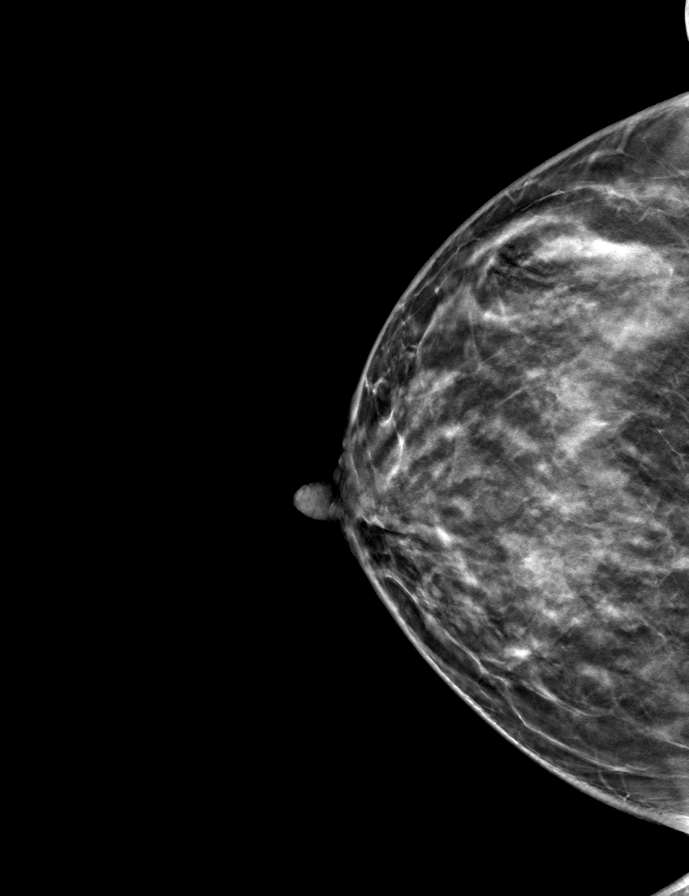

[R MLO tomo · tomo slice 34/67.0]
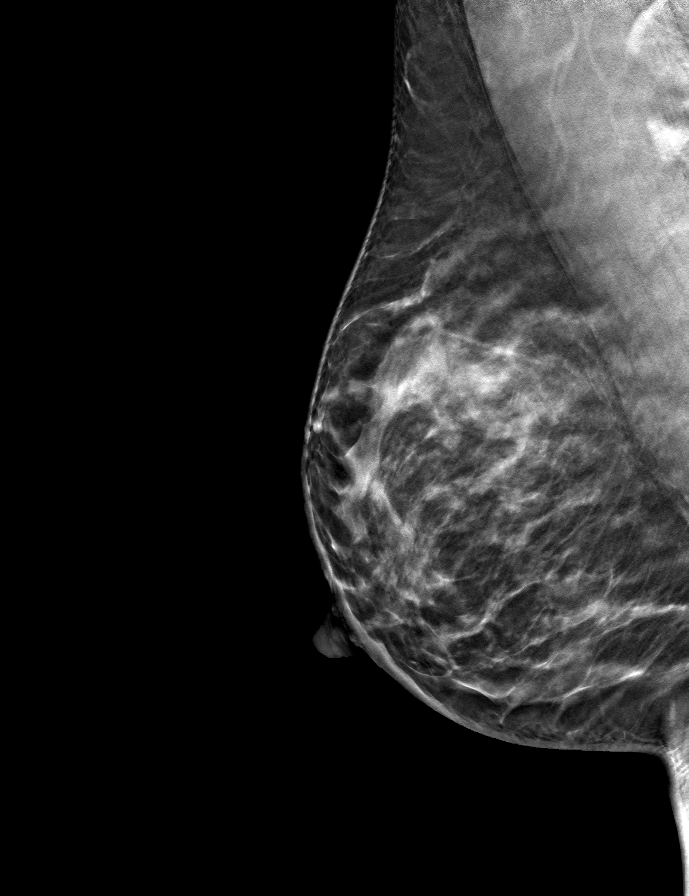

[L MLO tomo · tomo slice 36/71.0]
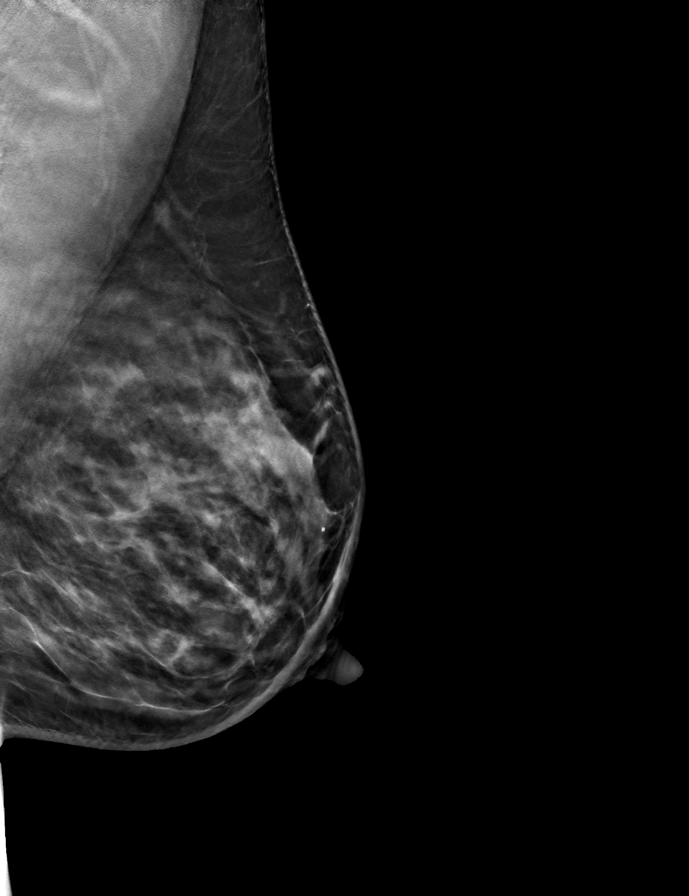

[9 of 24 positions shown; findings below may reference images not displayed]

ACR Breast Density Category c: The breast tissue is heterogeneously
dense, which may obscure small masses.
FINDINGS: There are no findings suspicious for malignancy. Images were
processed with CAD.
IMPRESSION: No mammographic evidence of malignancy. A result letter of this
screening mammogram will be mailed directly to the patient.

RECOMMENDATION:
Screening mammogram in one year. (Code:[id number])

BI-RADS CATEGORY  1: Negative.

## 2019-03-13 ENCOUNTER — Telehealth: Payer: Self-pay | Admitting: Cardiovascular Disease

## 2019-03-13 NOTE — Telephone Encounter (Signed)
Lm for recall °

## 2019-07-30 ENCOUNTER — Other Ambulatory Visit: Payer: Self-pay

## 2019-07-31 ENCOUNTER — Telehealth: Payer: Self-pay

## 2019-07-31 ENCOUNTER — Encounter: Payer: Self-pay | Admitting: Women's Health

## 2019-07-31 ENCOUNTER — Ambulatory Visit (INDEPENDENT_AMBULATORY_CARE_PROVIDER_SITE_OTHER): Payer: 59 | Admitting: Women's Health

## 2019-07-31 VITALS — BP 124/80 | Ht 62.0 in | Wt 188.0 lb

## 2019-07-31 DIAGNOSIS — N898 Other specified noninflammatory disorders of vagina: Secondary | ICD-10-CM | POA: Diagnosis not present

## 2019-07-31 DIAGNOSIS — Z01419 Encounter for gynecological examination (general) (routine) without abnormal findings: Secondary | ICD-10-CM | POA: Diagnosis not present

## 2019-07-31 DIAGNOSIS — Z23 Encounter for immunization: Secondary | ICD-10-CM | POA: Diagnosis not present

## 2019-07-31 LAB — WET PREP FOR TRICH, YEAST, CLUE

## 2019-07-31 MED ORDER — ACYCLOVIR 400 MG PO TABS: ORAL_TABLET | ORAL | 6 refills | Status: DC

## 2019-07-31 MED ORDER — FLUCONAZOLE 100 MG PO TABS: ORAL_TABLET | ORAL | 0 refills | Status: DC

## 2019-07-31 MED ORDER — ALPRAZOLAM 0.25 MG PO TABS: 0.2500 mg | ORAL_TABLET | Freq: Every evening | ORAL | 1 refills | Status: DC | PRN

## 2019-07-31 MED ORDER — VALACYCLOVIR HCL 500 MG PO TABS: ORAL_TABLET | ORAL | 12 refills | Status: DC

## 2019-07-31 NOTE — Telephone Encounter (Signed)
Okay acyclovir 400 mg 4 times daily for 3 to 5 days as needed #30 with refills

## 2019-07-31 NOTE — Telephone Encounter (Signed)
Rx sent 

## 2019-07-31 NOTE — Progress Notes (Signed)
Megan Pace [birth year] [id number]    History:    Presents for annual exam.  02/2015 Mirena IUD amenorrheic.  Not sexually active denies need for STD screen.  Normal Pap and mammogram history.  Hypertension, prediabetes, anxiety and depression primary care manages.  HSV-1 rare outbreaks.  Has had some increased anxiety over the past year with Covid, daughter's home schooling and work.  Had lost weight last year and has regained.  Past medical history, past surgical history, family history and social history were all reviewed and documented in the EPIC chart.  Education officer, museum.  Daughters are 35 and 53 both have had Gardasil.  Numerous families with type 2 diabetes.  ROS:  A ROS was performed and pertinent positives and negatives are included.  Exam:  Vitals:   07/31/19 1157  BP: 124/80  Weight: 188 lb (85.3 kg)  Height: 5\' 2"  (1.575 m)   Body mass index is 34.39 kg/m.   General appearance:  Normal Thyroid:  Symmetrical, normal in size, without palpable masses or nodularity. Respiratory  Auscultation:  Clear without wheezing or rhonchi Cardiovascular  Auscultation:  Regular rate, without rubs, murmurs or gallops  Edema/varicosities:  Not grossly evident Abdominal  Soft,nontender, without masses, guarding or rebound.  Liver/spleen:  No organomegaly noted  Hernia:  None appreciated  Skin  Inspection:  Grossly normal   Breasts: Examined lying and sitting.     Right: Without masses, retractions, discharge or axillary adenopathy.     Left: Without masses, retractions, discharge or axillary adenopathy. Gentitourinary   Inguinal/mons:  Normal without inguinal adenopathy  External genitalia:  Normal  BUS/Urethra/Skene's glands:  Normal  Vagina:  Normal  Cervix:  Normal IUD strings visible  Uterus:   normal in size, shape and contour.  Midline and mobile  Adnexa/parametria:     Rt: Without masses or tenderness.   Lt: Without masses or tenderness.  Anus and  perineum: Normal  Digital rectal exam: Normal sphincter tone without palpated masses or tenderness  Assessment/Plan:  43 y.o. D WF G4, P2 for annual exam with no complaints.  02/2015 Mirena IUD amenorrheic Hypertension, prediabetes, anxiety/depression-primary care manages labs and meds Obesity  Plan: Situational stress, daughter's online schooling, social worker during [place], encouraged to continue Wellbutrin as prescribed by primary care, Rx for Xanax 0.25 as used in the past aware of addictive properties and to use sparingly.  SBEs, annual screening mammogram due instructed to schedule.  Reviewed importance of increasing regular cardio and balance type exercise, decreasing calorie/carbs, vitamin D 1000 daily encouraged.  Reviewed Mirena IUD will need to be removed and replaced 02/2020.  Pap normal 2018, new screening guidelines reviewed.    [employer], 1:13 PM 07/31/2019

## 2019-07-31 NOTE — Telephone Encounter (Signed)
Note from pharmacy regarding Valcyclovir prescribed today.  "Drug not covered by patient plan. The preferred alternative is Acyclovir Tab. Please send new Rx"

## 2019-07-31 NOTE — Patient Instructions (Addendum)
It was good to see you today Vitamin D 1000 daily Health Maintenance, Female Adopting a healthy lifestyle and getting preventive care are important in promoting health and wellness. Ask your health care provider about:  The right schedule for you to have regular tests and exams.  Things you can do on your own to prevent diseases and keep yourself healthy. What should I know about diet, weight, and exercise? Eat a healthy diet   Eat a diet that includes plenty of vegetables, fruits, low-fat dairy products, and lean protein.  Do not eat a lot of foods that are high in solid fats, added sugars, or sodium. Maintain a healthy weight Body mass index (BMI) is used to identify weight problems. It estimates body fat based on height and weight. Your health care provider can help determine your BMI and help you achieve or maintain a healthy weight. Get regular exercise Get regular exercise. This is one of the most important things you can do for your health. Most adults should:  Exercise for at least 150 minutes each week. The exercise should increase your heart rate and make you sweat (moderate-intensity exercise).  Do strengthening exercises at least twice a week. This is in addition to the moderate-intensity exercise.  Spend less time sitting. Even light physical activity can be beneficial. Watch cholesterol and blood lipids Have your blood tested for lipids and cholesterol at 45 years of age, then have this test every 5 years. Have your cholesterol levels checked more often if:  Your lipid or cholesterol levels are high.  You are older than 45 years of age.  You are at high risk for heart disease. What should I know about cancer screening? Depending on your health history and family history, you may need to have cancer screening at various ages. This may include screening for:  Breast cancer.  Cervical cancer.  Colorectal cancer.  Skin cancer.  Lung cancer. What should I know  about heart disease, diabetes, and high blood pressure? Blood pressure and heart disease  High blood pressure causes heart disease and increases the risk of stroke. This is more likely to develop in people who have high blood pressure readings, are of African descent, or are overweight.  Have your blood pressure checked: ? Every 3-5 years if you are 38-11 years of age. ? Every year if you are 5 years old or older. Diabetes Have regular diabetes screenings. This checks your fasting blood sugar level. Have the screening done:  Once every three years after age 43 if you are at a normal weight and have a low risk for diabetes.  More often and at a younger age if you are overweight or have a high risk for diabetes. What should I know about preventing infection? Hepatitis B If you have a higher risk for hepatitis B, you should be screened for this virus. Talk with your health care provider to find out if you are at risk for hepatitis B infection. Hepatitis C Testing is recommended for:  Everyone born from [birth year] through 1965.  Anyone with known risk factors for hepatitis C. Sexually transmitted infections (STIs)  Get screened for STIs, including gonorrhea and chlamydia, if: ? You are sexually active and are younger than 45 years of age. ? You are older than 45 years of age and your health care provider tells you that you are at risk for this type of infection. ? Your sexual activity has changed since you were last screened, and you are at increased  risk for chlamydia or gonorrhea. Ask your health care provider if you are at risk.  Ask your health care provider about whether you are at high risk for HIV. Your health care provider may recommend a prescription medicine to help prevent HIV infection. If you choose to take medicine to prevent HIV, you should first get tested for HIV. You should then be tested every 3 months for as long as you are taking the medicine. Pregnancy  If you are about  to stop having your period (premenopausal) and you may become pregnant, seek counseling before you get pregnant.  Take 400 to 800 micrograms (mcg) of folic acid every day if you become pregnant.  Ask for birth control (contraception) if you want to prevent pregnancy. Osteoporosis and menopause Osteoporosis is a disease in which the bones lose minerals and strength with aging. This can result in bone fractures. If you are 21 years old or older, or if you are at risk for osteoporosis and fractures, ask your health care provider if you should:  Be screened for bone loss.  Take a calcium or vitamin D supplement to lower your risk of fractures.  Be given hormone replacement therapy (HRT) to treat symptoms of menopause. Follow these instructions at home: Lifestyle  Do not use any products that contain nicotine or tobacco, such as cigarettes, e-cigarettes, and chewing tobacco. If you need help quitting, ask your health care provider.  Do not use street drugs.  Do not share needles.  Ask your health care provider for help if you need support or information about quitting drugs. Alcohol use  Do not drink alcohol if: ? Your health care provider tells you not to drink. ? You are pregnant, may be pregnant, or are planning to become pregnant.  If you drink alcohol: ? Limit how much you use to 0-1 drink a day. ? Limit intake if you are breastfeeding.  Be aware of how much alcohol is in your drink. In the [place] one drink equals one 12 oz bottle of beer (355 mL), one 5 oz glass of wine (148 mL), or one 1 oz glass of hard liquor (44 mL). General instructions  Schedule regular health, dental, and eye exams.  Stay current with your vaccines.  Tell your health care provider if: ? You often feel depressed. ? You have ever been abused or do not feel safe at home. Summary  Adopting a healthy lifestyle and getting preventive care are important in promoting health and wellness.  Follow your  health care provider's instructions about healthy diet, exercising, and getting tested or screened for diseases.  Follow your health care provider's instructions on monitoring your cholesterol and blood pressure. This information is not intended to replace advice given to you by your health care provider. Make sure you discuss any questions you have with your health care provider. Document Released: 02/22/2011 Document Revised: 08/02/2018 Document Reviewed: 08/02/2018 Elsevier Patient Education  2020 [employer] for Diabetes Mellitus, Adult  Carbohydrate counting is a method of keeping track of how many carbohydrates you eat. Eating carbohydrates naturally increases the amount of sugar (glucose) in the blood. Counting how many carbohydrates you eat helps keep your blood glucose within normal limits, which helps you manage your diabetes (diabetes mellitus). It is important to know how many carbohydrates you can safely have in each meal. This is different for every person. A diet and nutrition specialist (registered dietitian) can help you make a meal plan and calculate how many  carbohydrates you should have at each meal and snack. Carbohydrates are found in the following foods:  Grains, such as breads and cereals.  Dried beans and soy products.  Starchy vegetables, such as potatoes, peas, and corn.  Fruit and fruit juices.  Milk and yogurt.  Sweets and snack foods, such as cake, cookies, candy, chips, and soft drinks. How do I count carbohydrates? There are two ways to count carbohydrates in food. You can use either of the methods or a combination of both. Reading "Nutrition Facts" on packaged food The "Nutrition Facts" list is included on the labels of almost all packaged foods and beverages in the [place] It includes:  The serving size.  Information about nutrients in each serving, including the grams (g) of carbohydrate per serving. To use the "Nutrition  Facts":  Decide how many servings you will have.  Multiply the number of servings by the number of carbohydrates per serving.  The resulting number is the total amount of carbohydrates that you will be having. Learning standard serving sizes of other foods When you eat carbohydrate foods that are not packaged or do not include "Nutrition Facts" on the label, you need to measure the servings in order to count the amount of carbohydrates:  Measure the foods that you will eat with a food scale or measuring cup, if needed.  Decide how many standard-size servings you will eat.  Multiply the number of servings by 15. Most carbohydrate-rich foods have about 15 g of carbohydrates per serving. ? For example, if you eat 8 oz (170 g) of strawberries, you will have eaten 2 servings and 30 g of carbohydrates (2 servings x 15 g = 30 g).  For foods that have more than one food mixed, such as soups and casseroles, you must count the carbohydrates in each food that is included. The following list contains standard serving sizes of common carbohydrate-rich foods. Each of these servings has about 15 g of carbohydrates:   hamburger bun or  English muffin.   oz (15 mL) syrup.   oz (14 g) jelly.  1 slice of bread.  1 six-inch tortilla.  3 oz (85 g) cooked rice or pasta.  4 oz (113 g) cooked dried beans.  4 oz (113 g) starchy vegetable, such as peas, corn, or potatoes.  4 oz (113 g) hot cereal.  4 oz (113 g) mashed potatoes or  of a large baked potato.  4 oz (113 g) canned or frozen fruit.  4 oz (120 mL) fruit juice.  4-6 crackers.  6 chicken nuggets.  6 oz (170 g) unsweetened dry cereal.  6 oz (170 g) plain fat-free yogurt or yogurt sweetened with artificial sweeteners.  8 oz (240 mL) milk.  8 oz (170 g) fresh fruit or one small piece of fruit.  24 oz (680 g) popped popcorn. Example of carbohydrate counting Sample meal  3 oz (85 g) chicken breast.  6 oz (170 g) brown  rice.  4 oz (113 g) corn.  8 oz (240 mL) milk.  8 oz (170 g) strawberries with sugar-free whipped topping. Carbohydrate calculation 1. Identify the foods that contain carbohydrates: ? Rice. ? Corn. ? Milk. ? Strawberries. 2. Calculate how many servings you have of each food: ? 2 servings rice. ? 1 serving corn. ? 1 serving milk. ? 1 serving strawberries. 3. Multiply each number of servings by 15 g: ? 2 servings rice x 15 g = 30 g. ? 1 serving corn x 15 g =  15 g. ? 1 serving milk x 15 g = 15 g. ? 1 serving strawberries x 15 g = 15 g. 4. Add together all of the amounts to find the total grams of carbohydrates eaten: ? 30 g + 15 g + 15 g + 15 g = 75 g of carbohydrates total. Summary  Carbohydrate counting is a method of keeping track of how many carbohydrates you eat.  Eating carbohydrates naturally increases the amount of sugar (glucose) in the blood.  Counting how many carbohydrates you eat helps keep your blood glucose within normal limits, which helps you manage your diabetes.  A diet and nutrition specialist (registered dietitian) can help you make a meal plan and calculate how many carbohydrates you should have at each meal and snack. This information is not intended to replace advice given to you by your health care provider. Make sure you discuss any questions you have with your health care provider. Document Released: 08/09/2005 Document Revised: 03/03/2017 Document Reviewed: 01/21/2016 Elsevier Patient Education  2020 [employer].

## 2020-03-03 ENCOUNTER — Other Ambulatory Visit: Payer: Self-pay | Admitting: Women's Health

## 2020-03-03 DIAGNOSIS — Z1231 Encounter for screening mammogram for malignant neoplasm of breast: Secondary | ICD-10-CM

## 2020-03-05 ENCOUNTER — Other Ambulatory Visit: Payer: Self-pay

## 2020-03-05 ENCOUNTER — Ambulatory Visit
Admission: RE | Admit: 2020-03-05 | Discharge: 2020-03-05 | Disposition: A | Discharge status: Home / Self Care | Payer: 59 | Source: Ambulatory Visit

## 2020-03-05 DIAGNOSIS — Z1231 Encounter for screening mammogram for malignant neoplasm of breast: Secondary | ICD-10-CM

## 2020-03-07 ENCOUNTER — Ambulatory Visit: Payer: 59 | Admitting: Obstetrics & Gynecology

## 2020-03-07 ENCOUNTER — Encounter: Payer: Self-pay | Admitting: Obstetrics & Gynecology

## 2020-03-07 ENCOUNTER — Other Ambulatory Visit: Payer: Self-pay

## 2020-03-07 VITALS — BP 126/80

## 2020-03-07 DIAGNOSIS — Z30433 Encounter for removal and reinsertion of intrauterine contraceptive device: Secondary | ICD-10-CM | POA: Diagnosis not present

## 2020-03-07 DIAGNOSIS — N898 Other specified noninflammatory disorders of vagina: Secondary | ICD-10-CM

## 2020-03-07 NOTE — Progress Notes (Signed)
Megan Pace 06/28/1976 [id number]   History:    44 y.o. G4P2A2L2  RP:  Removal/Insertion of Mirena IUD  HPI:  Mirena IUD x 02/2015.  Amenorrheic.  No pelvic pain.  Currently abstinent.  Felt a vaginal mass when inserting the Boric Acid.   Past medical history,surgical history, family history and social history were all reviewed and documented in the EPIC chart.  Gynecologic History No LMP recorded (lmp unknown). (Menstrual status: IUD).  Obstetric History OB History  Gravida Para Term Preterm AB Living  4 2 2   1 2   SAB TAB Ectopic Multiple Live Births  1       2    # Outcome Date GA Lbr Len/2nd Weight Sex Delivery Anes PTL Lv  4 SAB           3 Gravida           2 Term     F Vag-Spont  N LIV  1 Term     F Vag-Spont  N LIV     ROS: A ROS was performed and pertinent positives and negatives are included in the history.  GENERAL: No fevers or chills. HEENT: No change in vision, no earache, sore throat or sinus congestion. NECK: No pain or stiffness. CARDIOVASCULAR: No chest pain or pressure. No palpitations. PULMONARY: No shortness of breath, cough or wheeze. GASTROINTESTINAL: No abdominal pain, nausea, vomiting or diarrhea, melena or bright red blood per rectum. GENITOURINARY: No urinary frequency, urgency, hesitancy or dysuria. MUSCULOSKELETAL: No joint or muscle pain, no back pain, no recent trauma. DERMATOLOGIC: No rash, no itching, no lesions. ENDOCRINE: No polyuria, polydipsia, no heat or cold intolerance. No recent change in weight. HEMATOLOGICAL: No anemia or easy bruising or bleeding. NEUROLOGIC: No headache, seizures, numbness, tingling or weakness. PSYCHIATRIC: No depression, no loss of interest in normal activity or change in sleep pattern.     Exam:   BP 126/80 (BP Location: Right Arm, Patient Position: Sitting, Cuff Size: Large)   LMP  (LMP Unknown)                                                                     IUD procedure note       Patient  presented to the office today for removal and placement of Mirena IUD. The patient had previously been provided with literature information on this method of contraception. The risks benefits and pros and cons were discussed and all her questions were answered. She is fully aware that this form of contraception is 99% effective and is good for 5 years.  Pelvic exam: Vulva normal Vagina: No lesions or discharge Cervix: No lesions or discharge.  Easy removal of IUD by pulling on strings.  IUD complete, intact. Uterus: AV position, normal volume Adnexa: No masses or tenderness Rectal exam: Not done  The cervix was cleansed with Betadine solution. Hurricane spray on the cervix.  A single-tooth tenaculum was placed on the anterior cervical lip. The IUD was shown to the patient and inserted in a sterile fashion.  Hysterometry with the IUD as being inserted was 7 cm.  The IUD string was trimmed. The single-tooth tenaculum was removed. Patient was instructed to return back to the office in one month  for follow up.        Assessment/Plan:  44 y.o. female   1. Encounter for IUD removal and reinsertion Easy Mirena IUD removal after 5 years.  New Mirena IUD inserted without difficulty or complication.  Well-tolerated by patient.  Postprocedure precautions reviewed.  Follow-up in 4 weeks for IUD check.  2. Vaginal mass Normal gynecologic exam.  Patient reassured, probably felt her cervix when she was inserting the Boric Acid.  Princess Bruins MD, 9:20 AM 03/07/2020

## 2020-03-12 ENCOUNTER — Encounter: Payer: Self-pay | Admitting: Anesthesiology

## 2020-04-02 ENCOUNTER — Ambulatory Visit: Payer: 59 | Admitting: Obstetrics & Gynecology

## 2020-04-02 DIAGNOSIS — Z0289 Encounter for other administrative examinations: Secondary | ICD-10-CM

## 2020-08-04 ENCOUNTER — Encounter: Payer: 59 | Admitting: Nurse Practitioner

## 2020-08-18 ENCOUNTER — Other Ambulatory Visit: Payer: Self-pay | Admitting: *Deleted

## 2020-08-19 ENCOUNTER — Other Ambulatory Visit: Payer: Self-pay | Admitting: *Deleted

## 2020-08-19 MED ORDER — VALACYCLOVIR HCL 500 MG PO TABS: ORAL_TABLET | ORAL | 0 refills | Status: DC

## 2020-09-12 ENCOUNTER — Other Ambulatory Visit: Payer: 59

## 2020-11-18 ENCOUNTER — Ambulatory Visit: Payer: 59 | Admitting: Nurse Practitioner

## 2020-11-26 ENCOUNTER — Telehealth: Payer: Self-pay

## 2020-11-26 DIAGNOSIS — F419 Anxiety disorder, unspecified: Secondary | ICD-10-CM

## 2020-11-26 MED ORDER — ALPRAZOLAM 0.25 MG PO TABS: 0.2500 mg | ORAL_TABLET | Freq: Every evening | ORAL | 0 refills | Status: DC | PRN

## 2020-11-26 NOTE — Telephone Encounter (Signed)
Last annual exam was 07/31/2019 with [place]. Scheduled for 12/10/2020 with Marny Lowenstein, NP  Patient requesting refill Xanax. Last prescribed in our practice by N. Young, NP on 12/8/202 for #30 x 1 refill.

## 2020-11-26 NOTE — Telephone Encounter (Signed)
Xanax 0.25 mg #30 with 0 refills provided. Thank you.

## 2020-12-03 ENCOUNTER — Other Ambulatory Visit: Payer: Self-pay | Admitting: Obstetrics & Gynecology

## 2020-12-04 NOTE — Telephone Encounter (Signed)
Annual exam scheduled on 12/10/20

## 2020-12-10 ENCOUNTER — Other Ambulatory Visit (HOSPITAL_COMMUNITY)
Admission: RE | Admit: 2020-12-10 | Discharge: 2020-12-10 | Disposition: A | Discharge status: Home / Self Care | Payer: 59 | Source: Ambulatory Visit | Attending: Nurse Practitioner | Admitting: Nurse Practitioner

## 2020-12-10 ENCOUNTER — Ambulatory Visit (INDEPENDENT_AMBULATORY_CARE_PROVIDER_SITE_OTHER): Payer: 59 | Admitting: Nurse Practitioner

## 2020-12-10 ENCOUNTER — Telehealth: Payer: Self-pay | Admitting: *Deleted

## 2020-12-10 ENCOUNTER — Encounter: Payer: Self-pay | Admitting: Nurse Practitioner

## 2020-12-10 ENCOUNTER — Other Ambulatory Visit: Payer: Self-pay

## 2020-12-10 VITALS — BP 140/90 | Ht 63.0 in | Wt 192.0 lb

## 2020-12-10 DIAGNOSIS — R4189 Other symptoms and signs involving cognitive functions and awareness: Secondary | ICD-10-CM

## 2020-12-10 DIAGNOSIS — Z30431 Encounter for routine checking of intrauterine contraceptive device: Secondary | ICD-10-CM | POA: Diagnosis not present

## 2020-12-10 DIAGNOSIS — Z01419 Encounter for gynecological examination (general) (routine) without abnormal findings: Secondary | ICD-10-CM | POA: Diagnosis present

## 2020-12-10 DIAGNOSIS — R413 Other amnesia: Secondary | ICD-10-CM

## 2020-12-10 DIAGNOSIS — Z808 Family history of malignant neoplasm of other organs or systems: Secondary | ICD-10-CM

## 2020-12-10 NOTE — Telephone Encounter (Signed)
Referral placed at [facility].

## 2020-12-10 NOTE — Telephone Encounter (Signed)
-----   Message from Tamela Gammon, NP sent at 12/10/2020  2:08 PM EDT ----- Please send referral to neurology for short term memory loss. Thank you.

## 2020-12-10 NOTE — Patient Instructions (Addendum)
Schedule colonoscopy! [facility] [phone], [place] [id number]   Health Maintenance, Female Adopting a healthy lifestyle and getting preventive care are important in promoting health and wellness. Ask your health care provider about:  The right schedule for you to have regular tests and exams.  Things you can do on your own to prevent diseases and keep yourself healthy. What should I know about diet, weight, and exercise? Eat a healthy diet  Eat a diet that includes plenty of vegetables, fruits, low-fat dairy products, and lean protein.  Do not eat a lot of foods that are high in solid fats, added sugars, or sodium.   Maintain a healthy weight Body mass index (BMI) is used to identify weight problems. It estimates body fat based on height and weight. Your health care provider can help determine your BMI and help you achieve or maintain a healthy weight. Get regular exercise Get regular exercise. This is one of the most important things you can do for your health. Most adults should:  Exercise for at least 150 minutes each week. The exercise should increase your heart rate and make you sweat (moderate-intensity exercise).  Do strengthening exercises at least twice a week. This is in addition to the moderate-intensity exercise.  Spend less time sitting. Even light physical activity can be beneficial. Watch cholesterol and blood lipids Have your blood tested for lipids and cholesterol at 47 years of age, then have this test every 5 years. Have your cholesterol levels checked more often if:  Your lipid or cholesterol levels are high.  You are older than 47 years of age.  You are at high risk for heart disease. What should I know about cancer screening? Depending on your health history and family history, you may need to have cancer screening at various ages. This may include screening for:  Breast cancer.  Cervical cancer.  Colorectal cancer.  Skin  cancer.  Lung cancer. What should I know about heart disease, diabetes, and high blood pressure? Blood pressure and heart disease  High blood pressure causes heart disease and increases the risk of stroke. This is more likely to develop in people who have high blood pressure readings, are of African descent, or are overweight.  Have your blood pressure checked: ? Every 3-5 years if you are 74-71 years of age. ? Every year if you are 82 years old or older. Diabetes Have regular diabetes screenings. This checks your fasting blood sugar level. Have the screening done:  Once every three years after age 26 if you are at a normal weight and have a low risk for diabetes.  More often and at a younger age if you are overweight or have a high risk for diabetes. What should I know about preventing infection? Hepatitis B If you have a higher risk for hepatitis B, you should be screened for this virus. Talk with your health care provider to find out if you are at risk for hepatitis B infection. Hepatitis C Testing is recommended for:  Everyone born from [birth year] through 1965.  Anyone with known risk factors for hepatitis C. Sexually transmitted infections (STIs)  Get screened for STIs, including gonorrhea and chlamydia, if: ? You are sexually active and are younger than 48 years of age. ? You are older than 47 years of age and your health care provider tells you that you are at risk for this type of infection. ? Your sexual activity has changed since you were last screened,  and you are at increased risk for chlamydia or gonorrhea. Ask your health care provider if you are at risk.  Ask your health care provider about whether you are at high risk for HIV. Your health care provider may recommend a prescription medicine to help prevent HIV infection. If you choose to take medicine to prevent HIV, you should first get tested for HIV. You should then be tested every 3 months for as long as you are taking  the medicine. Pregnancy  If you are about to stop having your period (premenopausal) and you may become pregnant, seek counseling before you get pregnant.  Take 400 to 800 micrograms (mcg) of folic acid every day if you become pregnant.  Ask for birth control (contraception) if you want to prevent pregnancy. Osteoporosis and menopause Osteoporosis is a disease in which the bones lose minerals and strength with aging. This can result in bone fractures. If you are 7 years old or older, or if you are at risk for osteoporosis and fractures, ask your health care provider if you should:  Be screened for bone loss.  Take a calcium or vitamin D supplement to lower your risk of fractures.  Be given hormone replacement therapy (HRT) to treat symptoms of menopause. Follow these instructions at home: Lifestyle  Do not use any products that contain nicotine or tobacco, such as cigarettes, e-cigarettes, and chewing tobacco. If you need help quitting, ask your health care provider.  Do not use street drugs.  Do not share needles.  Ask your health care provider for help if you need support or information about quitting drugs. Alcohol use  Do not drink alcohol if: ? Your health care provider tells you not to drink. ? You are pregnant, may be pregnant, or are planning to become pregnant.  If you drink alcohol: ? Limit how much you use to 0-1 drink a day. ? Limit intake if you are breastfeeding.  Be aware of how much alcohol is in your drink. In the [place] one drink equals one 12 oz bottle of beer (355 mL), one 5 oz glass of wine (148 mL), or one 1 oz glass of hard liquor (44 mL). General instructions  Schedule regular health, dental, and eye exams.  Stay current with your vaccines.  Tell your health care provider if: ? You often feel depressed. ? You have ever been abused or do not feel safe at home. Summary  Adopting a healthy lifestyle and getting preventive care are important in  promoting health and wellness.  Follow your health care provider's instructions about healthy diet, exercising, and getting tested or screened for diseases.  Follow your health care provider's instructions on monitoring your cholesterol and blood pressure. This information is not intended to replace advice given to you by your health care provider. Make sure you discuss any questions you have with your health care provider. Document Revised: 08/02/2018 Document Reviewed: 08/02/2018 Elsevier Patient Education  2021 [employer].

## 2020-12-10 NOTE — Progress Notes (Signed)
Megan Pace 03-24-1976 [id number]   History:  45 y.o. [id number] presents for annual exam. Mirena 02/2020, amenorrheic other than occasional light spotting. 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 Normal mammogram history. Pre-diabetes, HTN, anxiety/depression managed by PCP. She complains of worsening memory loss and brain fog over the last few years. Her daughters always tell her she does not remember things. She would like referral to neurology for further evaluation. Not sexually active.   Gynecologic History No LMP recorded. (Menstrual status: IUD).   Contraception/Family planning: IUD  Health Maintenance Last Pap: 10/11/2016. Results were: normal Last mammogram: 03/05/2020. Results were: normal Last colonoscopy: Never Last Dexa: N/A  Past medical history, past surgical history, family history and social history were all reviewed and documented in the EPIC chart. Divorced. 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 Education officer, museum. Mother with history of skin cancer.  ROS:  A ROS was performed and pertinent positives and negatives are included.  Exam:  Vitals:   12/10/20 1327  BP: 140/90  Weight: 192 lb (87.1 kg)  Height: 5\' 3"  (1.6 m)   Body mass index is 34.01 kg/m.  General appearance:  Normal Thyroid:  Symmetrical, normal in size, without palpable masses or nodularity. Respiratory  Auscultation:  Clear without wheezing or rhonchi Cardiovascular  Auscultation:  Regular rate, without rubs, murmurs or gallops  Edema/varicosities:  Not grossly evident Abdominal  Soft,nontender, without masses, guarding or rebound.  Liver/spleen:  No organomegaly noted  Hernia:  None appreciated  Skin  Inspection:  Many seborrheic keratoses on back with 1 on left mid back that has slightly irregular borders and is skin-colored. Breasts: Examined lying and sitting.   Right: Without masses, retractions, nipple discharge or axillary adenopathy.   Left: Without masses, retractions, nipple discharge  or axillary adenopathy. Gentitourinary   Inguinal/mons:  Normal without inguinal adenopathy  External genitalia:  Normal appearing vulva with no masses, tenderness, or lesions  BUS/Urethra/Skene's glands:  Normal  Vagina:  Normal appearing with normal color and discharge, no lesions  Cervix:  Normal appearing without discharge or lesions. IUD string visible at exocervix  Uterus:  Normal in size, shape and contour.  Midline and mobile, nontender  Adnexa/parametria:     Rt: Normal in size, without masses or tenderness.   Lt: Normal in size, without masses or tenderness.  Anus and perineum: Normal  Digital rectal exam: Normal sphincter tone without palpated masses or tenderness  Assessment/Plan:  46 y.o. [id number] for annual exam.   Well female exam with routine gynecological exam - Plan: Cytology - PAP( [facility]). Education provided on SBEs, importance of preventative screenings, current guidelines, high calcium diet, regular exercise, and multivitamin daily. Labs with PCP.   Encounter for routine checking of intrauterine contraceptive device (IUD) - Mirena inserted 02/2020 for contraception. Discussed FDA approval for 7 years. Amenorrheic.   Memory loss - feels this has worsened over the last couple of years. Her daughters always tell her she does not remember thingsShe would like neurology referral for evaluation.   Brain fog  Family history of skin cancer - mother with history of skin cancer. Patient has multiple seborrheic keratoses on back with 1 on left mid back that has slightly irregular borders and is skin-colored. She reports that it itches often. She has a dermatologist but has not see her in a while. It is recommended she follow up with derm and to continue annual skin checks. She is agreeable.   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  Pap with HR HPV today.   Screening for breast cancer - Normal mammogram history.  Continue annual screenings.   Normal breast exam today.  Screening for colon cancer - Has not had screening colonoscopy. Information provided on [facility] and she will call to schedule this soon.   Return in 1 year for annual.    Tamela Gammon DNP, 2:06 PM 12/10/2020

## 2020-12-11 ENCOUNTER — Encounter: Payer: Self-pay | Admitting: Nurse Practitioner

## 2020-12-11 LAB — CYTOLOGY - PAP
Comment: NEGATIVE
Diagnosis: NEGATIVE
High risk HPV: NEGATIVE

## 2020-12-16 NOTE — Telephone Encounter (Signed)
[facility] has left message for patient to call x1.

## 2020-12-25 ENCOUNTER — Encounter: Payer: Self-pay | Admitting: Cardiology

## 2020-12-25 ENCOUNTER — Other Ambulatory Visit: Payer: Self-pay

## 2020-12-25 ENCOUNTER — Ambulatory Visit (INDEPENDENT_AMBULATORY_CARE_PROVIDER_SITE_OTHER): Payer: 59

## 2020-12-25 ENCOUNTER — Ambulatory Visit: Payer: 59 | Admitting: Cardiology

## 2020-12-25 VITALS — BP 140/77 | HR 98 | Ht 63.0 in | Wt 194.8 lb

## 2020-12-25 DIAGNOSIS — M79604 Pain in right leg: Secondary | ICD-10-CM | POA: Diagnosis not present

## 2020-12-25 DIAGNOSIS — R002 Palpitations: Secondary | ICD-10-CM

## 2020-12-25 DIAGNOSIS — R0602 Shortness of breath: Secondary | ICD-10-CM

## 2020-12-25 DIAGNOSIS — M79605 Pain in left leg: Secondary | ICD-10-CM

## 2020-12-25 DIAGNOSIS — I10 Essential (primary) hypertension: Secondary | ICD-10-CM

## 2020-12-25 MED ORDER — METOPROLOL SUCCINATE ER 50 MG PO TB24: 50.0000 mg | ORAL_TABLET | Freq: Every day | ORAL | 3 refills | Status: DC

## 2020-12-25 NOTE — Progress Notes (Deleted)
Cardiology Office Note:    Date:  12/25/2020   ID:  Megan Pace, DOB Jul 13, 1976, MRN [id number]  PCP:  Kathyrn Lass, MD  Cardiologist:  None  Electrophysiologist:  None   Referring MD: Kathyrn Lass, MD   No chief complaint on file. ***  History of Present Illness:    Megan Pace is a 45 y.o. female with a hx of hypertension, OSA on CPAP, prediabetes who is referred by Dr. Sabra Heck for evaluation of hypertension.  Previously followed with cardiology, last seen by Dr. Oval Linsey in 2017 for palpitations.  She wore a 7-day event monitor that showed symptomatic PVCs and was started on metoprolol with improvement.  Echocardiogram showed LVEF 65 to 70%.    Past Medical History:  Diagnosis Date  . ADD (attention deficit disorder with hyperactivity)   . Anxiety   . Depression   . Diabetes mellitus    GESTATIONAL  . Essential hypertension 01/24/2016  . HSV-2 (herpes simplex virus 2) infection   . Hypertension   . Kidney stones   . NSVD (normal spontaneous vaginal delivery)   . Obesity (BMI 35.0-39.9 without comorbidity) 01/24/2016  . Palpitations   . RBBB (right bundle branch block)    w/ leftward axis & borderline LAFB  . SOB (shortness of breath)   . Systolic murmur    Echo w/EF =>55% year 2008  . Tachycardia     Past Surgical History:  Procedure Laterality Date  . CERVICAL BIOPSY  W/ LOOP ELECTRODE EXCISION  01/2001   MARGINS FREE CIN 2  . LAPAROSCOPIC CHOLECYSTECTOMY    . MOUTH SURGERY      Current Medications: No outpatient medications have been marked as taking for the 12/25/20 encounter (Appointment) with Donato Heinz, MD.     Allergies:   Patient has no known allergies.   Social History   Socioeconomic History  . Marital status: Divorced    Spouse name: Not on file  . Number of children: Not on file  . Years of education: Not on file  . Highest education level: Not on file  Occupational History  . Not on file  Tobacco Use  . Smoking  status: Never Smoker  . Smokeless tobacco: Never Used  Vaping Use  . Vaping Use: Never used  Substance and Sexual Activity  . Alcohol use: No    Alcohol/week: 0.0 standard drinks  . Drug use: No  . Sexual activity: Not Currently    Birth control/protection: I.U.D.    Comment: Mirena inserted 03-11-15  Other Topics Concern  . Not on file  Social History Narrative  . Not on file   Social Determinants of Health   Financial Resource Strain: Not on file  Food Insecurity: Not on file  Transportation Needs: Not on file  Physical Activity: Not on file  Stress: Not on file  Social Connections: Not on file     Family History: The patient's ***family history includes Cancer in her mother; Diabetes in her maternal grandfather and mother; Heart disease in her father; Hyperlipidemia in her father, maternal grandfather, and mother; Hypertension in her father and paternal grandmother; Vascular Disease in her mother. There is no history of Breast cancer.  ROS:   Please see the history of present illness.    *** All other systems reviewed and are negative.  EKGs/Labs/Other Studies Reviewed:    The following studies were reviewed today: ***  EKG:  EKG is *** ordered today.  The ekg ordered today demonstrates ***  Recent  Labs: No results found for requested labs within last 8760 hours.  Recent Lipid Panel    Component Value Date/Time   CHOL 176 10/11/2016 0854   TRIG 275 (H) 10/11/2016 0854   HDL 31 (L) 10/11/2016 0854   CHOLHDL 5.7 (H) 10/11/2016 0854   VLDL 55 (H) 10/11/2016 0854   LDLCALC 90 10/11/2016 0854    Physical Exam:    VS:  There were no vitals taken for this visit.    Wt Readings from Last 3 Encounters:  12/10/20 192 lb (87.1 kg)  07/31/19 188 lb (85.3 kg)  07/26/18 173 lb (78.5 kg)     GEN: *** Well nourished, well developed in no acute distress HEENT: Normal NECK: No JVD; No carotid bruits LYMPHATICS: No lymphadenopathy CARDIAC: ***RRR, no murmurs, rubs,  gallops RESPIRATORY:  Clear to auscultation without rales, wheezing or rhonchi  ABDOMEN: Soft, non-tender, non-distended MUSCULOSKELETAL:  No edema; No deformity  SKIN: Warm and dry NEUROLOGIC:  Alert and oriented x 3 PSYCHIATRIC:  Normal affect   ASSESSMENT:    No diagnosis found. PLAN:    Palpitations: Suspect PVCs.  Occurs mainly at night, but is only taking her Lopressor in the morning.  We will switch from Lopressor to Toprol-XL 50 mg daily.  Check Zio patch x14 days.  Dyspnea.  Will check echocardiogram to evaluate for structural heart disease.  Leg pain: We will check ABIs  Hypertension: Switch Lopressor to Toprol-XL as above.  Asked patient to check BP twice daily for next week and call with results.  Suspect will need an additional antihypertensive  RTC in 3 months    Medication Adjustments/Labs and Tests Ordered: Current medicines are reviewed at length with the patient today.  Concerns regarding medicines are outlined above.  No orders of the defined types were placed in this encounter.  No orders of the defined types were placed in this encounter.   There are no Patient Instructions on file for this visit.   Signed, Donato Heinz, MD  12/25/2020 6:10 AM    [place]

## 2020-12-25 NOTE — Progress Notes (Unsigned)
Patient enrolled for [employer] to ship a 14 day ZIO XT monitor to her home. 

## 2020-12-25 NOTE — Patient Instructions (Signed)
Medication Instructions:  STOP metoprolol tartrate (Lopressor) START metoprolol succinate (Toprol XL) 50 mg once daily  Please check your blood pressure at home twice daily, write it down.  Call the office or send message via Mychart with the readings in 2 weeks for Dr. Gardiner Rhyme to review.   *If you need a refill on your cardiac medications before your next appointment, please call your pharmacy*  Testing/Procedures: Your physician has requested that you have an echocardiogram. Echocardiography is a painless test that uses sound waves to create images of your heart. It provides your doctor with information about the size and shape of your heart and how well your heart's chambers and valves are working. This procedure takes approximately one hour. There are no restrictions for this procedure.  This will be done at our [facility] location:  [place] has requested that you have an ankle brachial index (ABI). During this test an ultrasound and blood pressure cuff are used to evaluate the arteries that supply the arms and legs with blood. Allow thirty minutes for this exam. There are no restrictions or special instructions.  ZIO XT- Long Term Monitor Instructions   Your physician has requested you wear your ZIO patch monitor__14___days.   This is a single patch monitor.  [employer] supplies one patch monitor per enrollment.  Additional stickers are not available.   Please do not apply patch if you will be having a Nuclear Stress Test, Echocardiogram, Cardiac CT, MRI, or Chest Xray during the time frame you would be wearing the monitor. The patch cannot be worn during these tests.  You cannot remove and re-apply the ZIO XT patch monitor.   Your ZIO patch monitor will be sent USPS Priority mail from [facility] directly to your home address. The monitor may also be mailed to a PO BOX if home delivery is not available.   It may take 3-5 days to receive  your monitor after you have been enrolled.   Once you have received you monitor, please review enclosed instructions.  Your monitor has already been registered assigning a specific monitor serial # to you.   Applying the monitor   Shave hair from upper left chest.   Hold abrader disc by orange tab.  Rub abrader in 40 strokes over left upper chest as indicated in your monitor instructions.   Clean area with 4 enclosed alcohol pads .  Use all pads to assure are is cleaned thoroughly.  Let dry.   Apply patch as indicated in monitor instructions.  Patch will be place under collarbone on left side of chest with arrow pointing upward.   Rub patch adhesive wings for 2 minutes.Remove white label marked "1".  Remove white label marked "2".  Rub patch adhesive wings for 2 additional minutes.   While looking in a mirror, press and release button in center of patch.  A small green light will flash 3-4 times .  This will be your only indicator the monitor has been turned on.     Do not shower for the first 24 hours.  You may shower after the first 24 hours.   Press button if you feel a symptom. You will hear a small click.  Record Date, Time and Symptom in the Patient Log Book.   When you are ready to remove patch, follow instructions on last 2 pages of Patient Log Book.  Stick patch monitor onto last page of Patient Log Book.  Place Patient Log Book in [place] box.  Use locking tab on box and tape box closed securely.  The [employer] has [employer] on it.  Please place in mailbox as soon as possible.  Your physician should have your test results approximately 7 days after the monitor has been mailed back to [facility].   Call [facility] at [phone] if you have questions regarding your ZIO XT patch monitor.  Call them immediately if you see an orange light blinking on your monitor.   If your monitor falls off in less than 4 days contact our Monitor department at  [phone].  If your monitor becomes loose or falls off after 4 days call [employer] at [phone] for suggestions on securing your monitor.   Follow-Up: At [facility], you and your health needs are our priority.  As part of our continuing mission to provide you with exceptional heart care, we have created designated Provider Care Teams.  These Care Teams include your primary Cardiologist (physician) and Advanced Practice Providers (APPs -  Physician Assistants and Nurse Practitioners) who all work together to provide you with the care you need, when you need it.  We recommend signing up for the patient portal called "MyChart".  Sign up information is provided on this After Visit Summary.  MyChart is used to connect with patients for Virtual Visits (Telemedicine).  Patients are able to view lab/test results, encounter notes, upcoming appointments, etc.  Non-urgent messages can be sent to your provider as well.   To learn more about what you can do with MyChart, go to [email or web address].    Your next appointment:   3 month(s)  The format for your next appointment:   In Person  Provider:   Oswaldo Milian, MD

## 2020-12-25 NOTE — Progress Notes (Signed)
Cardiology Office Note:    Date:  12/26/2020   ID:  Megan Pace, DOB 07-24-1976, MRN [id number]  PCP:  Megan Lass, MD  Cardiologist:  None  Electrophysiologist:  None   Referring MD: Megan Lass, MD   Chief Complaint  Patient presents with  . Palpitations    History of Present Illness:    Megan Pace is a 45 y.o. female with a hx of hypertension, OSA on CPAP, T2DM who is referred by Dr. Sabra Pace for evaluation of hypertension.  Previously followed with cardiology, last seen by Dr. Oval Pace in 2017 for palpitations.  She wore a 7-day event monitor that showed symptomatic PVCs and was started on metoprolol with improvement.  Echocardiogram showed LVEF 65 to 70%.  Today, she continues to have "flutters". Recently, her blood pressure has been climbing. Typically her at home blood pressure averages 130/70 to 140-150/90. She is concerned that her left arm blood pressure is usually higher than in her right arm. Mostly she notices the palpitations when lying down in bed. They will usually last several seconds before stopping, but may start again when she moves. Since 2017 her palpitations have decreased, and now occur about 2-3 nights a week. When active, her shortness of breath has worsened lately, and she also describes having a "fullness" and flushed feeling in her chest and face after exertion. She started using compression socks recently, and has noticed sometimes that after using them she feels like the pressure in her upper body increases. She has occasional LE edema and leg pain. Does not smoke or drink alcohol. She confirms having sleep apnea and regularly uses her CPAP. She is also pre-diabetic. In her family, her mother, aunt and grandfather had circulation issues. Her grandfather and aunt had leg amputations. She is not aware of any heart attacks in her family. However, she knows there is a family history of diabetes. Of note, her PCP recently increased her metoprolol to  50 mg twice a day but she reports she is only been taking her morning dose.   Past Medical History:  Diagnosis Date  . ADD (attention deficit disorder with hyperactivity)   . Anxiety   . Depression   . Diabetes mellitus    GESTATIONAL  . Essential hypertension 01/24/2016  . HSV-2 (herpes simplex virus 2) infection   . Hypertension   . Kidney stones   . NSVD (normal spontaneous vaginal delivery)   . Obesity (BMI 35.0-39.9 without comorbidity) 01/24/2016  . Palpitations   . RBBB (right bundle branch block)    w/ leftward axis & borderline LAFB  . SOB (shortness of breath)   . Systolic murmur    Echo w/EF =>55% year 2008  . Tachycardia     Past Surgical History:  Procedure Laterality Date  . CERVICAL BIOPSY  W/ LOOP ELECTRODE EXCISION  01/2001   MARGINS FREE CIN 2  . LAPAROSCOPIC CHOLECYSTECTOMY    . MOUTH SURGERY      Current Medications: Current Meds  Medication Sig  . acyclovir (ZOVIRAX) 400 MG tablet Take one tab po qid for 3-5 days with outbreak.  Marland Kitchen ALPRAZolam (XANAX) 0.25 MG tablet Take 1 tablet (0.25 mg total) by mouth at bedtime as needed for anxiety or sleep.  Marland Kitchen buPROPion (WELLBUTRIN XL) 150 MG 24 hr tablet Take 150 mg by mouth every morning.  Marland Kitchen buPROPion (WELLBUTRIN XL) 300 MG 24 hr tablet Take 1 tablet by mouth daily.  Marland Kitchen FIBER PO Take by mouth daily.  . metoprolol succinate (  TOPROL-XL) 50 MG 24 hr tablet Take 1 tablet (50 mg total) by mouth daily. Take with or immediately following a meal.  . Multiple Vitamin (MULTIVITAMIN) tablet Take 1 tablet by mouth daily. When she remembers.  . valACYclovir (VALTREX) 500 MG tablet TAKE 1 TABLET BY MOUTH TWICE DAILY FOR 3 TO 5 DAYS AS NEEDED  . [DISCONTINUED] metoprolol tartrate (LOPRESSOR) 25 MG tablet Take 0.5 tablets (12.5 mg total) by mouth 2 (two) times daily.     Allergies:   Patient has no known allergies.   Social History   Socioeconomic History  . Marital status: Divorced    Spouse name: Not on file  . Number of  children: Not on file  . Years of education: Not on file  . Highest education level: Not on file  Occupational History  . Not on file  Tobacco Use  . Smoking status: Never Smoker  . Smokeless tobacco: Never Used  Vaping Use  . Vaping Use: Never used  Substance and Sexual Activity  . Alcohol use: No    Alcohol/week: 0.0 standard drinks  . Drug use: No  . Sexual activity: Not Currently    Birth control/protection: I.U.D.    Comment: Mirena inserted 03-11-15  Other Topics Concern  . Not on file  Social History Narrative  . Not on file   Social Determinants of Health   Financial Resource Strain: Not on file  Food Insecurity: Not on file  Transportation Needs: Not on file  Physical Activity: Not on file  Stress: Not on file  Social Connections: Not on file     Family History: The patient's family history includes Cancer in her mother; Diabetes in her maternal grandfather and mother; Heart disease in her father; Hyperlipidemia in her father, maternal grandfather, and mother; Hypertension in her father and paternal grandmother; Vascular Disease in her mother. There is no history of Breast cancer.  ROS:   Please see the history of present illness.    (+) Palpitations (+) Edema (+) Flushed/"Fullness" of chest and face (+) Shortness of breath (+) Leg pain All other systems reviewed and are negative.  EKGs/Labs/Other Studies Reviewed:    The following studies were reviewed today:   EKG:  12/25/20: Sinus rhythm. Rate 98 bpm. Nonspecific T wave flattening.  Recent Labs: No results found for requested labs within last 8760 hours.  Recent Lipid Panel    Component Value Date/Time   CHOL 176 10/11/2016 0854   TRIG 275 (H) 10/11/2016 0854   HDL 31 (L) 10/11/2016 0854   CHOLHDL 5.7 (H) 10/11/2016 0854   VLDL 55 (H) 10/11/2016 0854   LDLCALC 90 10/11/2016 0854    Physical Exam:    VS:  BP 140/77   Pulse 98   Ht 5\' 3"  (1.6 m)   Wt 194 lb 12.8 oz (88.4 kg)   SpO2 97%    BMI 34.51 kg/m     Wt Readings from Last 3 Encounters:  12/25/20 194 lb 12.8 oz (88.4 kg)  12/10/20 192 lb (87.1 kg)  07/31/19 188 lb (85.3 kg)     GEN: Well nourished, well developed in no acute distress HEENT: Normal NECK: No JVD; No carotid bruits LYMPHATICS: No lymphadenopathy CARDIAC: RRR, no murmurs, rubs, gallops RESPIRATORY:  Clear to auscultation without rales, wheezing or rhonchi  ABDOMEN: Soft, non-tender, non-distended MUSCULOSKELETAL:  No edema; No deformity  SKIN: Warm and dry NEUROLOGIC:  Alert and oriented x 3 PSYCHIATRIC:  Normal affect   ASSESSMENT:    1. Palpitations  2. Shortness of breath   3. Pain in both lower extremities   4. Essential hypertension    PLAN:    Palpitations: Suspect PVCs.  Occurs mainly at night, but is only taking her Lopressor in the morning.  We will switch from Lopressor to Toprol-XL 50 mg daily.  Check Zio patch x14 days.  Dyspnea.  Will check echocardiogram to evaluate for structural heart disease.  Leg pain: We will check ABIs  Hypertension: Switch Lopressor to Toprol-XL as above.  Asked patient to check BP twice daily for next week and call with results.  Suspect will need an additional antihypertensive  RTC in 3 months  Medication Adjustments/Labs and Tests Ordered: Current medicines are reviewed at length with the patient today.  Concerns regarding medicines are outlined above.  Orders Placed This Encounter  Procedures  . LONG TERM MONITOR (3-14 DAYS)  . EKG 12-Lead  . ECHOCARDIOGRAM COMPLETE  . VAS [place] LOWER EXTREMITY ARTERIAL DUPLEX  . VAS [place] ABI WITH/WO TBI   Meds ordered this encounter  Medications  . metoprolol succinate (TOPROL-XL) 50 MG 24 hr tablet    Sig: Take 1 tablet (50 mg total) by mouth daily. Take with or immediately following a meal.    Dispense:  90 tablet    Refill:  3    STOP lopressor    Patient Instructions  Medication Instructions:  STOP metoprolol tartrate (Lopressor) START  metoprolol succinate (Toprol XL) 50 mg once daily  Please check your blood pressure at home twice daily, write it down.  Call the office or send message via Mychart with the readings in 2 weeks for Dr. Gardiner Rhyme to review.   *If you need a refill on your cardiac medications before your next appointment, please call your pharmacy*  Testing/Procedures: Your physician has requested that you have an echocardiogram. Echocardiography is a painless test that uses sound waves to create images of your heart. It provides your doctor with information about the size and shape of your heart and how well your heart's chambers and valves are working. This procedure takes approximately one hour. There are no restrictions for this procedure.  This will be done at our [facility] location:  [place] has requested that you have an ankle brachial index (ABI). During this test an ultrasound and blood pressure cuff are used to evaluate the arteries that supply the arms and legs with blood. Allow thirty minutes for this exam. There are no restrictions or special instructions.  ZIO XT- Long Term Monitor Instructions   Your physician has requested you wear your ZIO patch monitor__14___days.   This is a single patch monitor.  [employer] supplies one patch monitor per enrollment.  Additional stickers are not available.   Please do not apply patch if you will be having a Nuclear Stress Test, Echocardiogram, Cardiac CT, MRI, or Chest Xray during the time frame you would be wearing the monitor. The patch cannot be worn during these tests.  You cannot remove and re-apply the ZIO XT patch monitor.   Your ZIO patch monitor will be sent USPS Priority mail from [facility] directly to your home address. The monitor may also be mailed to a PO BOX if home delivery is not available.   It may take 3-5 days to receive your monitor after you have been enrolled.   Once you have received you  monitor, please review enclosed instructions.  Your monitor has already been registered assigning a specific  monitor serial # to you.   Applying the monitor   Shave hair from upper left chest.   Hold abrader disc by orange tab.  Rub abrader in 40 strokes over left upper chest as indicated in your monitor instructions.   Clean area with 4 enclosed alcohol pads .  Use all pads to assure are is cleaned thoroughly.  Let dry.   Apply patch as indicated in monitor instructions.  Patch will be place under collarbone on left side of chest with arrow pointing upward.   Rub patch adhesive wings for 2 minutes.Remove white label marked "1".  Remove white label marked "2".  Rub patch adhesive wings for 2 additional minutes.   While looking in a mirror, press and release button in center of patch.  A small green light will flash 3-4 times .  This will be your only indicator the monitor has been turned on.     Do not shower for the first 24 hours.  You may shower after the first 24 hours.   Press button if you feel a symptom. You will hear a small click.  Record Date, Time and Symptom in the Patient Log Book.   When you are ready to remove patch, follow instructions on last 2 pages of Patient Log Book.  Stick patch monitor onto last page of Patient Log Book.   Place Patient Log Book in [place] box.  Use locking tab on box and tape box closed securely.  The [employer] has [employer] on it.  Please place in mailbox as soon as possible.  Your physician should have your test results approximately 7 days after the monitor has been mailed back to [facility].   Call [place] at [phone] if you have questions regarding your ZIO XT patch monitor.  Call them immediately if you see an orange light blinking on your monitor.   If your monitor falls off in less than 4 days contact our Monitor department at [phone].  If your monitor becomes loose or falls off after 4 days  call [employer] at [phone] for suggestions on securing your monitor.   Follow-Up: At [facility], you and your health needs are our priority.  As part of our continuing mission to provide you with exceptional heart care, we have created designated Provider Care Teams.  These Care Teams include your primary Cardiologist (physician) and Advanced Practice Providers (APPs -  Physician Assistants and Nurse Practitioners) who all work together to provide you with the care you need, when you need it.  We recommend signing up for the patient portal called "MyChart".  Sign up information is provided on this After Visit Summary.  MyChart is used to connect with patients for Virtual Visits (Telemedicine).  Patients are able to view lab/test results, encounter notes, upcoming appointments, etc.  Non-urgent messages can be sent to your provider as well.   To learn more about what you can do with MyChart, go to [email or web address].    Your next appointment:   3 month(s)  The format for your next appointment:   In Person  Provider:   Oswaldo Milian, MD        [facility],acting as a scribe for Donato Heinz, MD.,have documented all relevant documentation on the behalf of Donato Heinz, MD,as directed by  Donato Heinz, MD while in the presence of Donato Heinz, MD.  I, Donato Heinz, MD, have reviewed all documentation for this visit. The documentation  on 12/26/20 for the exam, diagnosis, procedures, and orders are all accurate and complete.   Signed, Donato Heinz, MD  12/26/2020 9:12 AM    [place]

## 2020-12-29 NOTE — Telephone Encounter (Signed)
Patient scheduled on 03/12/21  with Star Age, MD

## 2020-12-31 ENCOUNTER — Other Ambulatory Visit: Payer: Self-pay

## 2020-12-31 ENCOUNTER — Ambulatory Visit (HOSPITAL_COMMUNITY)
Admission: RE | Admit: 2020-12-31 | Discharge: 2020-12-31 | Disposition: A | Discharge status: Home / Self Care | Payer: 59 | Source: Ambulatory Visit | Attending: Cardiovascular Disease | Admitting: Cardiovascular Disease

## 2020-12-31 DIAGNOSIS — M79605 Pain in left leg: Secondary | ICD-10-CM | POA: Diagnosis present

## 2020-12-31 DIAGNOSIS — R002 Palpitations: Secondary | ICD-10-CM

## 2020-12-31 DIAGNOSIS — M79604 Pain in right leg: Secondary | ICD-10-CM | POA: Diagnosis present

## 2021-01-23 ENCOUNTER — Other Ambulatory Visit: Payer: Self-pay

## 2021-01-23 ENCOUNTER — Ambulatory Visit (HOSPITAL_COMMUNITY): Payer: 59 | Attending: Cardiology

## 2021-01-23 DIAGNOSIS — R0602 Shortness of breath: Secondary | ICD-10-CM | POA: Diagnosis not present

## 2021-01-23 LAB — ECHOCARDIOGRAM COMPLETE
Area-P 1/2: 4.89 cm2
S' Lateral: 3 cm

## 2021-03-03 ENCOUNTER — Encounter: Payer: Self-pay | Admitting: *Deleted

## 2021-03-10 ENCOUNTER — Ambulatory Visit: Payer: 59 | Attending: Family Medicine | Admitting: Audiology

## 2021-03-12 ENCOUNTER — Ambulatory Visit: Payer: 59 | Admitting: Neurology

## 2021-03-29 NOTE — Progress Notes (Deleted)
Cardiology Office Note:    Date:  03/29/2021   ID:  Megan Pace, DOB 22-Jul-1976, MRN [id number]  PCP:  Kathyrn Lass, MD  Cardiologist:  None  Electrophysiologist:  None   Referring MD: Kathyrn Lass, MD   No chief complaint on file.   History of Present Illness:    Megan Pace is a 45 y.o. female with a hx of hypertension, OSA on CPAP, T2DM who presents for follow-up.  She was referred by Dr. Sabra Heck for evaluation of hypertension, initially seen on 12/25/2020.  Previously followed with cardiology, last seen by Dr. Oval Linsey in 2017 for palpitations.  She wore a 7-day event monitor that showed symptomatic PVCs and was started on metoprolol with improvement.  Echocardiogram showed LVEF 65 to 70%.  At initial clinic visit on 12/25/2020 she reported worsening palpitations.  Zio patch x13 days on 01/30/2021 showed no significant arrhythmia.  Echocardiogram on 01/23/2021 showed normal biventricular function, no significant valvular disease.  Since last clinic visit,  Past Medical History:  Diagnosis Date   ADD (attention deficit disorder with hyperactivity)    Anxiety    Depression    Diabetes mellitus    GESTATIONAL   Essential hypertension 01/24/2016   HSV-2 (herpes simplex virus 2) infection    Hypertension    Kidney stones    NSVD (normal spontaneous vaginal delivery)    Obesity (BMI 35.0-39.9 without comorbidity) 01/24/2016   Palpitations    RBBB (right bundle branch block)    w/ leftward axis & borderline LAFB   SOB (shortness of breath)    Systolic murmur    Echo w/EF =>55% year 2008   Tachycardia     Past Surgical History:  Procedure Laterality Date   CERVICAL BIOPSY  W/ LOOP ELECTRODE EXCISION  01/2001   MARGINS FREE CIN 2   LAPAROSCOPIC CHOLECYSTECTOMY     MOUTH SURGERY      Current Medications: No outpatient medications have been marked as taking for the 03/31/21 encounter (Appointment) with Donato Heinz, MD.     Allergies:   Patient has no  known allergies.   Social History   Socioeconomic History   Marital status: Divorced    Spouse name: Not on file   Number of children: Not on file   Years of education: Not on file   Highest education level: Not on file  Occupational History   Not on file  Tobacco Use   Smoking status: Never   Smokeless tobacco: Never  Vaping Use   Vaping Use: Never used  Substance and Sexual Activity   Alcohol use: No    Alcohol/week: 0.0 standard drinks   Drug use: No   Sexual activity: Not Currently    Birth control/protection: I.U.D.    Comment: Mirena inserted 03-11-15  Other Topics Concern   Not on file  Social History Narrative   Not on file   Social Determinants of Health   Financial Resource Strain: Not on file  Food Insecurity: Not on file  Transportation Needs: Not on file  Physical Activity: Not on file  Stress: Not on file  Social Connections: Not on file     Family History: The patient's family history includes Cancer in her mother; Diabetes in her maternal grandfather and mother; Heart disease in her father; Hyperlipidemia in her father, maternal grandfather, and mother; Hypertension in her father and paternal grandmother; Vascular Disease in her mother. There is no history of Breast cancer.  ROS:   Please see the history of  present illness.    (+) Palpitations (+) Edema (+) Flushed/"Fullness" of chest and face (+) Shortness of breath (+) Leg pain All other systems reviewed and are negative.  EKGs/Labs/Other Studies Reviewed:    The following studies were reviewed today:   EKG:  12/25/20: Sinus rhythm. Rate 98 bpm. Nonspecific T wave flattening.  Recent Labs: No results found for requested labs within last 8760 hours.  Recent Lipid Panel    Component Value Date/Time   CHOL 176 10/11/2016 0854   TRIG 275 (H) 10/11/2016 0854   HDL 31 (L) 10/11/2016 0854   CHOLHDL 5.7 (H) 10/11/2016 0854   VLDL 55 (H) 10/11/2016 0854   LDLCALC 90 10/11/2016 0854     Physical Exam:    VS:  There were no vitals taken for this visit.    Wt Readings from Last 3 Encounters:  12/25/20 194 lb 12.8 oz (88.4 kg)  12/10/20 192 lb (87.1 kg)  07/31/19 188 lb (85.3 kg)     GEN: Well nourished, well developed in no acute distress HEENT: Normal NECK: No JVD; No carotid bruits LYMPHATICS: No lymphadenopathy CARDIAC: RRR, no murmurs, rubs, gallops RESPIRATORY:  Clear to auscultation without rales, wheezing or rhonchi  ABDOMEN: Soft, non-tender, non-distended MUSCULOSKELETAL:  No edema; No deformity  SKIN: Warm and dry NEUROLOGIC:  Alert and oriented x 3 PSYCHIATRIC:  Normal affect   ASSESSMENT:    No diagnosis found.  PLAN:    Palpitations: Suspect PVCs.  Occurs mainly at night, but is only taking her Lopressor in the morning.  Switched from Lopressor to Toprol-XL 50 mg daily.  Zio patch x13 days on 01/30/2021 showed no significant arrhythmia.  Dyspnea.  Echocardiogram on 01/23/2021 showed normal biventricular function, no significant valvular disease.  Leg pain: normal ABIs on 12/31/20.  Hypertension: Continue Toprol-XL 50 mg daily  RTC in***  Medication Adjustments/Labs and Tests Ordered: Current medicines are reviewed at length with the patient today.  Concerns regarding medicines are outlined above.  No orders of the defined types were placed in this encounter.  No orders of the defined types were placed in this encounter.   There are no Patient Instructions on file for this visit.     Signed, Donato Heinz, MD  03/29/2021 5:00 PM    [place]

## 2021-03-30 ENCOUNTER — Ambulatory Visit: Payer: 59 | Admitting: Cardiology

## 2021-03-31 ENCOUNTER — Telehealth (HOSPITAL_BASED_OUTPATIENT_CLINIC_OR_DEPARTMENT_OTHER): Payer: Self-pay | Admitting: Cardiology

## 2021-03-31 ENCOUNTER — Ambulatory Visit (HOSPITAL_BASED_OUTPATIENT_CLINIC_OR_DEPARTMENT_OTHER): Payer: 59 | Admitting: Cardiology

## 2021-03-31 NOTE — Telephone Encounter (Signed)
Called and spoke with patient to reschedule her 03/31/21 appointment with Dr. Gardiner Rhyme.  Patient states she will call back when she is feeling better and can look at her work schedule.

## 2021-04-15 ENCOUNTER — Ambulatory Visit: Payer: 59 | Admitting: Neurology

## 2021-04-15 ENCOUNTER — Encounter: Payer: Self-pay | Admitting: Neurology

## 2021-04-15 VITALS — BP 122/83 | HR 105 | Ht 62.0 in | Wt 195.0 lb

## 2021-04-15 DIAGNOSIS — G43009 Migraine without aura, not intractable, without status migrainosus: Secondary | ICD-10-CM

## 2021-04-15 DIAGNOSIS — F419 Anxiety disorder, unspecified: Secondary | ICD-10-CM | POA: Diagnosis not present

## 2021-04-15 DIAGNOSIS — F32A Depression, unspecified: Secondary | ICD-10-CM | POA: Diagnosis not present

## 2021-04-15 DIAGNOSIS — G3184 Mild cognitive impairment, so stated: Secondary | ICD-10-CM | POA: Diagnosis not present

## 2021-04-15 NOTE — Patient Instructions (Addendum)
For your migraines, please start nightly   Magnesium 400 mg   Riboflavin 400 mg   Coq 10 100 mg   Follow up with your primary care doctor, please check HgA1C at your next visit  Follow up with psychiatry, (referral given)  Return to clinic in 6 months

## 2021-04-15 NOTE — Progress Notes (Signed)
[facility]  PATIENT: Megan Pace DOB: 02-09-1976  REFERRING CLINICIAN: Tamela Gammon, NP HISTORY FROM: Patient  REASON FOR VISIT: Memory problems.    HISTORICAL  CHIEF COMPLAINT:  Chief Complaint  Patient presents with   New Patient (Initial Visit)    Rm 12, alone, Pt here to discuss short term memory problems and hearing problems, may be due to stress, family hx of dementia     HISTORY OF PRESENT ILLNESS:  This is a 45 year old woman with past medical history of hypertension, PCV, sleep apnea on CPAP who is presenting for memory problem.  Patient said that she noticed memory problem described as being forgetful for the past 2 years.  She also noted that daughter had has been complaining a lot about her memory, that she has been forgetting previous conversation, and that she has been forgetting for instance to get items for them at the grocery store.   Patient states that he has been forgetting about her plans, she will plan to do something then if she does not have a reminder, will forget about it.  She will forget about item at work, forgetting conversation and this is a little bothersome to her.  If going to the grocery store, she has to have a list or she will forget.  She has not had any issue while cooking meaning she has not burned pots, she has not been lost going to familiar places.  States that sometimes she will forget names of people that she met at church but not people and family member that she knows well she is not forgetting their names.  Patient reported history of dementia in the mother. She is a Education officer, museum.  Patient also mentioned that she gets about once a month migraine headaches, headaches are usually located in the right temporal region with sensitivity to light but no nausea no vomiting and no phonophobia.  Patient mentioned that headache usually lasts until she falls asleep.  She has tried Tylenol and ibuprofen without relief.   On occasion she will also have this sharp stabbing right temporal pain but these episodes usually last less than a minute and she usually gets them about once every 86-month they are different from her migraines.  She has a history of anxiety and depression and feels that her anxiety is getting a little worse.  She has occasional flushing of her skin, she feels her heart racing describes it as "just feeling ancy" Stated that sleep is good she gets about 7 hours sleep.  She has a sleep history of sleep apnea and uses CPAP   OTHER MEDICAL CONDITIONS: Hypertension, PVCs, Anxiety/Depression, sleep apnea, uses CPAP   REVIEW OF SYSTEMS: Full 14 system review of systems performed and negative with exception of: as noted in the HPI  ALLERGIES: No Known Allergies  HOME MEDICATIONS: Outpatient Medications Prior to Visit  Medication Sig Dispense Refill   acyclovir (ZOVIRAX) 400 MG tablet Take one tab po qid for 3-5 days with outbreak. 30 tablet 6   ALPRAZolam (XANAX) 0.25 MG tablet Take 1 tablet (0.25 mg total) by mouth at bedtime as needed for anxiety or sleep. 30 tablet 0   buPROPion (WELLBUTRIN XL) 150 MG 24 hr tablet Take 150 mg by mouth every morning.     buPROPion (WELLBUTRIN XL) 300 MG 24 hr tablet Take 1 tablet by mouth daily.     FIBER PO Take by mouth daily.     Multiple Vitamin (MULTIVITAMIN) tablet Take 1  tablet by mouth daily. When she remembers.     valACYclovir (VALTREX) 500 MG tablet TAKE 1 TABLET BY MOUTH TWICE DAILY FOR 3 TO 5 DAYS AS NEEDED 30 tablet 0   metoprolol succinate (TOPROL-XL) 50 MG 24 hr tablet Take 1 tablet (50 mg total) by mouth daily. Take with or immediately following a meal. 90 tablet 3   No facility-administered medications prior to visit.    PAST MEDICAL HISTORY: Past Medical History:  Diagnosis Date   ADD (attention deficit disorder with hyperactivity)    Anxiety    Depression    Diabetes mellitus    GESTATIONAL   Essential hypertension 01/24/2016    HSV-2 (herpes simplex virus 2) infection    Hypertension    Kidney stones    NSVD (normal spontaneous vaginal delivery)    Obesity (BMI 35.0-39.9 without comorbidity) 01/24/2016   Palpitations    RBBB (right bundle branch block)    w/ leftward axis & borderline LAFB   SOB (shortness of breath)    Systolic murmur    Echo w/EF =>55% year 2008   Tachycardia     PAST SURGICAL HISTORY: Past Surgical History:  Procedure Laterality Date   CERVICAL BIOPSY  W/ LOOP ELECTRODE EXCISION  01/2001   MARGINS FREE CIN 2   LAPAROSCOPIC CHOLECYSTECTOMY     MOUTH SURGERY      FAMILY HISTORY: Family History  Problem Relation Age of Onset   Diabetes Mother        SKIN    Cancer Mother        SKIN   Hyperlipidemia Mother    Vascular Disease Mother    Hypertension Father    Hyperlipidemia Father    Heart disease Father    Hypertension Paternal Grandmother    Diabetes Maternal Grandfather    Hyperlipidemia Maternal Grandfather    Breast cancer Neg Hx     SOCIAL HISTORY: Social History   Socioeconomic History   Marital status: Divorced    Spouse name: Not on file   Number of children: Not on file   Years of education: Not on file   Highest education level: Not on file  Occupational History   Not on file  Tobacco Use   Smoking status: Never   Smokeless tobacco: Never  Vaping Use   Vaping Use: Never used  Substance and Sexual Activity   Alcohol use: No    Alcohol/week: 0.0 standard drinks   Drug use: No   Sexual activity: Not Currently    Birth control/protection: I.U.D.    Comment: Mirena inserted 03-11-15  Other Topics Concern   Not on file  Social History Narrative   Not on file   Social Determinants of Health   Financial Resource Strain: Not on file  Food Insecurity: Not on file  Transportation Needs: Not on file  Physical Activity: Not on file  Stress: Not on file  Social Connections: Not on file  Intimate Partner Violence: Not on file     PHYSICAL  EXAM  GENERAL EXAM/CONSTITUTIONAL: Vitals:  Vitals:   04/15/21 1300  BP: 122/83  Pulse: (!) 105  Weight: 195 lb (88.5 kg)  Height: '5\' 2"'  (1.575 m)   Body mass index is 35.67 kg/m. Wt Readings from Last 3 Encounters:  04/15/21 195 lb (88.5 kg)  12/25/20 194 lb 12.8 oz (88.4 kg)  12/10/20 192 lb (87.1 kg)   Patient is in no distress; well developed, nourished and groomed; neck is supple  CARDIOVASCULAR: Examination of carotid arteries  is normal; no carotid bruits Regular rate and rhythm, no murmurs Examination of peripheral vascular system by observation and palpation is normal  EYES: Pupils round and reactive to light, Visual fields full to confrontation, Extraocular movements intacts,   MUSCULOSKELETAL: Gait, strength, tone, movements noted in Neurologic exam below  NEUROLOGIC: MENTAL STATUS:  MMSE - Mini Mental State Exam 04/15/2021  Orientation to time 5  Orientation to Place 5  Registration 3  Attention/ Calculation 4  Recall 2  Language- name 2 objects 2  Language- repeat 1  Language- follow 3 step command 3  Language- read & follow direction 1  Write a sentence 1  Copy design 1  Total score 28   awake, alert, oriented to person, place and time recent and remote memory intact normal attention and concentration language fluent, comprehension intact, naming intact fund of knowledge appropriate  CRANIAL NERVE:  2nd, 3rd, 4th, 6th - pupils equal and reactive to light, visual fields full to confrontation, extraocular muscles intact, no nystagmus 5th - facial sensation symmetric 7th - facial strength symmetric 8th - hearing intact 9th - palate elevates symmetrically, uvula midline 11th - shoulder shrug symmetric 12th - tongue protrusion midline  MOTOR:  normal bulk and tone, full strength in the BUE, BLE  SENSORY:  normal and symmetric to light touch, pinprick, temperature, vibration  COORDINATION:  finger-nose-finger, fine finger movements  normal  REFLEXES:  deep tendon reflexes present and symmetric  GAIT/STATION:  normal    DIAGNOSTIC DATA (LABS, IMAGING, TESTING) - I reviewed patient records, labs, notes, testing and imaging myself where available.  Lab Results  Component Value Date   WBC 8.0 10/11/2016   HGB 14.2 10/11/2016   HCT 41.5 10/11/2016   MCV 83.3 10/11/2016   PLT 383 10/11/2016      Component Value Date/Time   NA 138 10/11/2016 0854   K 4.2 10/11/2016 0854   CL 106 10/11/2016 0854   CO2 24 10/11/2016 0854   GLUCOSE 113 (H) 10/11/2016 0854   BUN 13 10/11/2016 0854   CREATININE 0.86 10/11/2016 0854   CALCIUM 8.8 10/11/2016 0854   PROT 6.8 10/11/2016 0854   ALBUMIN 4.2 10/11/2016 0854   AST 14 10/11/2016 0854   ALT 17 10/11/2016 0854   ALKPHOS 63 10/11/2016 0854   BILITOT 0.3 10/11/2016 0854   GFRNONAA >90 07/07/2012 1300   GFRAA >90 07/07/2012 1300   Lab Results  Component Value Date   CHOL 176 10/11/2016   HDL 31 (L) 10/11/2016   LDLCALC 90 10/11/2016   TRIG 275 (H) 10/11/2016   CHOLHDL 5.7 (H) 10/11/2016   No results found for: HGBA1C No results found for: VITAMINB12 Lab Results  Component Value Date   TSH 1.91 10/11/2016     ASSESSMENT AND PLAN  45 y.o. year old female with past medical history of hypertension, PVC, sleep apnea on CPAP and anxiety/depression who is presenting for memory complaint.  Patient reported history of being forgetful, she will forget items like her keys for examples, previous conversation and even her daughter has also mentioned that she is forgetful.  She denies forgetting people name denies being lost in familiar places but states on occasion she was late on her bills because she forget. On exam she score  a 28/30 on MMSE.  She also reports a long history of anxiety/depression, she is on Wellbutrin, uses to see a psychiatrist but has not seen him for the past 3 years, reports that anxiety is getting worse and she has  been experience heart racing,  flushed skin and feeling ancy. I do believe that her memory problems might be related to her anxiety/depression so that I recommended that she establish care with her psychiatrist again. I have also given her a new referral. For her once a month headaches, we discussed starting Magnesium, Riboflavin and CoQ10 as preventive and she is agreeable with plans. I will see her in 6 months for follow up.    1. Mild cognitive impairment   2. Migraine without aura and without status migrainosus, not intractable   3. Depression, unspecified depression type   4. Anxiety     PLAN: For your migraines, please start nightly   Magnesium 400 mg   Riboflavin 400 mg   Coq 10 100 mg   Follow up with your primary care doctor, please check HgA1C at your next visit  Follow up with psychiatry, (referral given)  Return to clinic in 6 months   Orders Placed This Encounter  Procedures   Ambulatory referral to Psychiatry     No orders of the defined types were placed in this encounter.   Return in about 6 months (around 10/16/2021).    Alric Ran, MD 04/15/2021, 1:55 PM  [facility] [address] [place], [place] [address] [phone]  [place]

## 2021-06-12 ENCOUNTER — Other Ambulatory Visit: Payer: Self-pay | Admitting: Nurse Practitioner

## 2021-06-12 DIAGNOSIS — F419 Anxiety disorder, unspecified: Secondary | ICD-10-CM

## 2021-06-12 NOTE — Telephone Encounter (Signed)
Megan Pace patient  Last annual exam 11/2020 Last filled on 11/26/20 #30 with 0 refills.

## 2021-10-20 ENCOUNTER — Other Ambulatory Visit: Payer: Self-pay | Admitting: Obstetrics & Gynecology

## 2021-10-20 NOTE — Telephone Encounter (Signed)
Last AEX 12/10/20--scheduled 12/15/21.

## 2021-10-21 ENCOUNTER — Encounter: Payer: Self-pay | Admitting: Neurology

## 2021-10-21 ENCOUNTER — Ambulatory Visit: Payer: 59 | Admitting: Neurology

## 2021-12-15 ENCOUNTER — Ambulatory Visit (INDEPENDENT_AMBULATORY_CARE_PROVIDER_SITE_OTHER): Payer: 59 | Admitting: Nurse Practitioner

## 2021-12-15 ENCOUNTER — Encounter: Payer: Self-pay | Admitting: Nurse Practitioner

## 2021-12-15 VITALS — BP 124/84 | Ht 63.5 in | Wt 188.0 lb

## 2021-12-15 DIAGNOSIS — Z01419 Encounter for gynecological examination (general) (routine) without abnormal findings: Secondary | ICD-10-CM | POA: Diagnosis not present

## 2021-12-15 DIAGNOSIS — N912 Amenorrhea, unspecified: Secondary | ICD-10-CM

## 2021-12-15 DIAGNOSIS — Z30431 Encounter for routine checking of intrauterine contraceptive device: Secondary | ICD-10-CM

## 2021-12-15 DIAGNOSIS — B009 Herpesviral infection, unspecified: Secondary | ICD-10-CM

## 2021-12-15 LAB — ESTRADIOL: Estradiol: 87 pg/mL

## 2021-12-15 LAB — FOLLICLE STIMULATING HORMONE: FSH: 15.4 m[IU]/mL

## 2021-12-15 MED ORDER — VALACYCLOVIR HCL 500 MG PO TABS: 500.0000 mg | ORAL_TABLET | Freq: Two times a day (BID) | ORAL | 5 refills | Status: DC

## 2021-12-15 NOTE — Progress Notes (Signed)
? ?Adjoa Althouse 18-Oct-1975 [id number] ? ? ?History:  46 y.o. [id number] presents for annual exam. Mirena 02/2020, amenorrheic. Feels like she is having mild menopausal symptoms related to brain fog and mood changes. Was evaluated for mild cognitive impairment by neurology and was suspected that it may be from depression and anxiety. Plans to discussed this with PCP. 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 Normal mammogram history. Pre-diabetes, anxiety/depression managed by PCP. Palpitations, HTN managed by cardiology.  ? ?Gynecologic History ?No LMP recorded. (Menstrual status: IUD). ?  ?Contraception/Family planning: IUD ?Sexually active: No ? ?Health Maintenance ?Last Pap: 12/10/2020. Results were: Normal, 3-year repeat ?Last mammogram: 03/05/2020. Results were: Normal ?Last colonoscopy: Never ?Last Dexa: N/A ? ?Past medical history, past surgical history, family history and social history were all reviewed and documented in the EPIC chart. 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 Oldest is a [place].  ? ?ROS:  A ROS was performed and pertinent positives and negatives are included. ? ?Exam: ? ?Vitals:  ? 12/15/21 1329  ?BP: 124/84  ?Weight: 188 lb (85.3 kg)  ?Height: 5' 3.5" (1.613 m)  ? ? ?Body mass index is 32.78 kg/m?. ? ?General appearance:  Normal ?Thyroid:  Symmetrical, normal in size, without palpable masses or nodularity. ?Respiratory ? Auscultation:  Clear without wheezing or rhonchi ?Cardiovascular ? Auscultation:  Regular rate, without rubs, murmurs or gallops ? Edema/varicosities:  Not grossly evident ?Abdominal ? Soft,nontender, without masses, guarding or rebound. ? Liver/spleen:  No organomegaly noted ? Hernia:  None appreciated ? Skin ? Inspection:  Many seborrheic keratoses on back with 1 on left mid back that has slightly irregular borders and is skin-colored. ?Breasts: Examined lying and sitting.  ? Right: Without masses, retractions, nipple discharge or axillary  adenopathy. ? ? Left: Without masses, retractions, nipple discharge or axillary adenopathy. ?Gentitourinary  ? Inguinal/mons:  Normal without inguinal adenopathy ? External genitalia:  Normal appearing vulva with no masses, tenderness, or lesions ? BUS/Urethra/Skene's glands:  Normal ? Vagina:  Normal appearing with normal color and discharge, no lesions ? Cervix:  Normal appearing without discharge or lesions. IUD string visible ? Uterus:  Normal in size, shape and contour.  Midline and mobile, nontender ? Adnexa/parametria:   ?  Rt: Normal in size, without masses or tenderness. ?  Lt: Normal in size, without masses or tenderness. ? Anus and perineum: Normal ? Digital rectal exam: Normal sphincter tone without palpated masses or tenderness ? ?Patient informed chaperone available to be present for breast and pelvic exam. Patient has requested no chaperone to be present. Patient has been advised what will be completed during breast and pelvic exam.  ? ? ?Assessment/Plan:  46 y.o. [id number] for annual exam.  ? ?Well female exam with routine gynecological exam - Education provided on SBEs, importance of preventative screenings, current guidelines, high calcium diet, regular exercise, and multivitamin daily. Labs with PCP.  ? ?Encounter for routine checking of intrauterine contraceptive device (IUD) - Mirena inserted 02/2020, amenorrheic. She is aware of 8-year FDA approval.  ? ?HSV-2 (herpes simplex virus 2) infection - Plan: valACYclovir (VALTREX) 500 MG tablet BID x 3-5 days at first sign of outbreak.  ? ?Amenorrhea - Plan: Estradiol, Follicle stimulating hormone. Feels like she is having mild menopausal symptoms related to brain fog and mood changes. She is aware hormone testing is not an accurate diagnosis for menopause in many cases and may require recheck in 3 months. Did recommend neuro magnesium supplements to help with brain fog.  ? ?  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 Will  repeat at 5-year interval per guidelines.  ? ?Screening for breast cancer - Normal mammogram history. Overdue and encouraged to schedule.  Continue annual screenings.  Normal breast exam today. ? ?Screening for colon cancer - Discussed current guidelines and importance of preventative screenings. We discussed colonoscopy versus cologuard. Information provided on [facility] and she will call to schedule this soon.  ? ?Return in 1 year for annual.  ? ? ? ? ?[place], 1:46 PM 12/15/2021 ? ?

## 2021-12-15 NOTE — Patient Instructions (Signed)
Schedule Colonoscopy! ?[facility] ?[phone] ?[address] [place], [place] [address] ? ?

## 2022-01-06 ENCOUNTER — Other Ambulatory Visit: Payer: Self-pay | Admitting: Cardiology

## 2023-01-12 ENCOUNTER — Ambulatory Visit: Payer: 59 | Admitting: Nurse Practitioner

## 2023-02-16 ENCOUNTER — Telehealth: Payer: Self-pay

## 2023-02-16 NOTE — Telephone Encounter (Signed)
Pt LVM in triage line inquiring when she is eligible to have IUD removed? States her AEX is coming up in 03/2023 and is wonder if eligible, could she have it removed at that appt date/time?

## 2023-02-17 NOTE — Telephone Encounter (Signed)
Spoke with patient. Mirena IUD placed 02/2020. States she is unsure if she wants it removed, would like to consider how her body may feel without hormones. Denies any current issues or concerns. Recommended patient discuss at AEX, can return for removal if she desires after further discussion. Advised I will update Tiffany and return call if any additional recommendations. Patient agreeable.   Routing to provider for final review. Patient is agreeable to disposition. Will close encounter.

## 2023-02-20 NOTE — Telephone Encounter (Signed)
Will discuss at her AEX. Thanks.

## 2023-03-07 ENCOUNTER — Other Ambulatory Visit: Payer: Self-pay

## 2023-03-07 DIAGNOSIS — B009 Herpesviral infection, unspecified: Secondary | ICD-10-CM

## 2023-03-07 MED ORDER — VALACYCLOVIR HCL 500 MG PO TABS: 500.0000 mg | ORAL_TABLET | Freq: Two times a day (BID) | ORAL | 0 refills | Status: DC

## 2023-03-07 NOTE — Telephone Encounter (Signed)
Medication refill request: valtrex  500 Last AEX:  11/25/21 Next AEX: 03/30/23 Last MMG (if hormonal medication request): na Refill authorized: #[id number] with 0 rf pended for today

## 2023-03-30 ENCOUNTER — Ambulatory Visit (INDEPENDENT_AMBULATORY_CARE_PROVIDER_SITE_OTHER): Payer: 59 | Admitting: Nurse Practitioner

## 2023-03-30 ENCOUNTER — Encounter: Payer: Self-pay | Admitting: Nurse Practitioner

## 2023-03-30 VITALS — BP 126/82 | HR 77 | Ht 62.0 in | Wt 194.0 lb

## 2023-03-30 DIAGNOSIS — F419 Anxiety disorder, unspecified: Secondary | ICD-10-CM

## 2023-03-30 DIAGNOSIS — Z30432 Encounter for removal of intrauterine contraceptive device: Secondary | ICD-10-CM

## 2023-03-30 DIAGNOSIS — Z1211 Encounter for screening for malignant neoplasm of colon: Secondary | ICD-10-CM

## 2023-03-30 DIAGNOSIS — Z01419 Encounter for gynecological examination (general) (routine) without abnormal findings: Secondary | ICD-10-CM

## 2023-03-30 DIAGNOSIS — B009 Herpesviral infection, unspecified: Secondary | ICD-10-CM

## 2023-03-30 MED ORDER — VALACYCLOVIR HCL 500 MG PO TABS: 500.0000 mg | ORAL_TABLET | Freq: Two times a day (BID) | ORAL | 0 refills | Status: AC

## 2023-03-30 MED ORDER — ALPRAZOLAM 0.25 MG PO TABS: 0.2500 mg | ORAL_TABLET | Freq: Every evening | ORAL | 0 refills | Status: AC | PRN

## 2023-03-30 NOTE — Progress Notes (Signed)
Megan Pace [birth year] [id number]   History:  48 y.o. [id number] presents for annual exam. Mirena 02/2020, amenorrheic. Would like IUD removed today. Has had more yeast infections the last couple of years and has eliminated many things in her life to help with anxiety and depression and feels it started when she got most recent IUD. Has suicidal ideations but never a plan. Sees therapist and working on self care practices, on medication management and feels the ones she is on now have been the best for her. 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 Normal mammogram history. Pre-diabetes managed by PCP. Palpitations, HTN managed by cardiology.   Gynecologic History No LMP recorded. (Menstrual status: IUD).   Contraception/Family planning: IUD Sexually active: No  Health Maintenance Last Pap: 12/10/2020. Results were: Normal neg HPV, 5-year repeat Last mammogram: 03/05/2020. Results were: Normal Last colonoscopy: Never Last Dexa: Not indicated  Past medical history, past surgical history, family history and social history were all reviewed and documented in the EPIC chart. 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   ROS:  A ROS was performed and pertinent positives and negatives are included.  Exam:  Vitals:   03/30/23 1330  BP: 126/82  Pulse: 77  SpO2: 100%  Weight: 194 lb (88 kg)  Height: 5\' 2"  (1.575 m)     Body mass index is 35.48 kg/m.  General appearance:  Normal Thyroid:  Symmetrical, normal in size, without palpable masses or nodularity. Respiratory  Auscultation:  Clear without wheezing or rhonchi Cardiovascular  Auscultation:  Regular rate, without rubs, murmurs or gallops  Edema/varicosities:  Not grossly evident Abdominal  Soft,nontender, without masses, guarding or rebound.  Liver/spleen:  No organomegaly noted  Hernia:  None appreciated  Skin  Inspection:  Many seborrheic keratoses on back  with 1 on left mid back that has slightly irregular borders and is skin-colored. Breasts: Examined lying and sitting.   Right: Without masses, retractions, nipple discharge or axillary adenopathy.   Left: Without masses, retractions, nipple discharge or axillary adenopathy. Gentitourinary   Inguinal/mons:  Normal without inguinal adenopathy  External genitalia:  Normal appearing vulva with no masses, tenderness, or lesions  BUS/Urethra/Skene's glands:  Normal  Vagina:  Normal appearing with normal color and discharge, no lesions  Cervix:  Normal appearing without discharge or lesions. IUD string visible, grasped with ring forceps and removed with ease  Uterus:  Normal in size, shape and contour.  Midline and mobile, nontender  Adnexa/parametria:     Rt: Normal in size, without masses or tenderness.   Lt: Normal in size, without masses or tenderness.  Anus and perineum: Normal  Digital rectal exam: Not indicated  Patient informed chaperone available to be present for breast and pelvic exam. Patient has requested no chaperone to be present. Patient has been advised what will be completed during breast and pelvic exam.    Assessment/Plan:  47 y.o. [id number] for annual exam.   Well female exam with routine gynecological exam - Education provided on SBEs, importance of preventative screenings, current guidelines, high calcium diet, regular exercise, and multivitamin daily. Labs with PCP.   Anxiety - Plan: ALPRAZolam (XANAX) 0.25 MG tablet as needed. Takes rarely for panic.   Screening for colon cancer - Plan: Cologuard. Discussed current guidelines and importance of preventative screenings. We discussed colonoscopy versus cologuard. Requests Cologuard.   Encounter for IUD removal - Plan: IUD removal  HSV-2 (herpes simplex virus 2) infection - Plan:  valACYclovir (VALTREX) 500 MG tablet BID x 3-5 days at first sign of outbreak.   Screening for cervical cancer - 2002 LEEP CIN-2, subsequent  paps normal. Will repeat at 5-year interval per guidelines.   Screening for breast cancer - Normal mammogram history. Overdue and encouraged to schedule. Normal breast exam today.   Return in 1 year for annual.      Olivia Mackie DNP, 2:27 PM 03/30/2023

## 2023-04-20 ENCOUNTER — Other Ambulatory Visit: Payer: Self-pay | Admitting: Pain Medicine

## 2023-04-20 DIAGNOSIS — Z1231 Encounter for screening mammogram for malignant neoplasm of breast: Secondary | ICD-10-CM

## 2024-01-10 ENCOUNTER — Other Ambulatory Visit: Payer: Self-pay | Admitting: *Deleted

## 2024-01-10 ENCOUNTER — Ambulatory Visit: Attending: Physician Assistant | Admitting: Physician Assistant

## 2024-01-10 ENCOUNTER — Encounter: Payer: Self-pay | Admitting: Physician Assistant

## 2024-01-10 VITALS — BP 140/90 | HR 92 | Ht 62.0 in | Wt 194.2 lb

## 2024-01-10 DIAGNOSIS — R072 Precordial pain: Secondary | ICD-10-CM

## 2024-01-10 DIAGNOSIS — R0602 Shortness of breath: Secondary | ICD-10-CM | POA: Diagnosis not present

## 2024-01-10 DIAGNOSIS — I10 Essential (primary) hypertension: Secondary | ICD-10-CM

## 2024-01-10 DIAGNOSIS — R002 Palpitations: Secondary | ICD-10-CM | POA: Diagnosis not present

## 2024-01-10 MED ORDER — HYDROCHLOROTHIAZIDE 12.5 MG PO CAPS: 12.5000 mg | ORAL_CAPSULE | Freq: Every day | ORAL | 3 refills | Status: AC

## 2024-01-10 NOTE — Assessment & Plan Note (Signed)
 Blood pressures have improved since resuming metoprolol  succinate. Blood pressure still above target.  Goal blood pressure <130/80.   - Start hydrochlorothiazide 12.5 mg daily. - BMET  - Recheck BMET 1-2 weeks - Continue metoprolol  succinate 50 mg daily

## 2024-01-10 NOTE — Patient Instructions (Addendum)
 Medication Instructions:  Your physician has recommended you make the following change in your medication:   START Hydrochlorothiazide 12.5 mg taking  1 daily  *If you need a refill on your cardiac medications before your next appointment, please call your pharmacy*  Lab Work: TODAY:  BMET & PRO BNP  1 WEEK, COME BACK TO [facility] ON THE 1ST FLOOR, OR A [facility] NEAR YOU FOR:  BMET  If you have labs (blood work) drawn today and your tests are completely normal, you will receive your results only by: MyChart Message (if you have MyChart) OR A paper copy in the mail If you have any lab test that is abnormal or we need to change your treatment, we will call you to review the results.  Testing/Procedures: Your physician has requested that you have an echocardiogram. Echocardiography is a painless test that uses sound waves to create images of your heart. It provides your doctor with information about the size and shape of your heart and how well your heart's chambers and valves are working. This procedure takes approximately one hour. There are no restrictions for this procedure. Please do NOT wear cologne, perfume, aftershave, or lotions (deodorant is allowed). Please arrive 15 minutes prior to your appointment time.  Please note: We ask at that you not bring children with you during ultrasound (echo/ vascular) testing. Due to room size and safety concerns, children are not allowed in the ultrasound rooms during exams. Our front office staff cannot provide observation of children in our lobby area while testing is being conducted. An adult accompanying a patient to their appointment will only be allowed in the ultrasound room at the discretion of the ultrasound technician under special circumstances. We apologize for any inconvenience.    Your physician has requested that you have a stress echocardiogram. For further information please visit [email or web address] Please follow instruction sheet  BELOW:  - DO NOT TAKE YOUR METOPROLOL  MEDICATION THE MORNING OF THE STRESS TEST.  BRING IT WITH YOU TO YOUR APPOINTMENT.  - DO NOT EAT, DRINK, OR USE TOBACCO PRODUCTS WITHIN 4 HOURS OF THE TEST - DRESS PREPARED TO EXERCISE IN A COMFORTABLE, 2 PIECE CLOTHING OUTFIT AND WALKING SHOES - BRING ANY PRESCRIPTION MEDICATIONS WITH YOU  - NOTIFY THE OFFICE 24 HOURS IN ADVANCE IF YOU NEED TO CANCEL - CALL THE OFFICE [phone] IF YOU HAVE ANY QUESTIONS   Please note: We ask at that you not bring children with you during ultrasound (echo/ vascular) testing. Due to room size and safety concerns, children are not allowed in the ultrasound rooms during exams. Our front office staff cannot provide observation of children in our lobby area while testing is being conducted. An adult accompanying a patient to their appointment will only be allowed in the ultrasound room at the discretion of the ultrasound technician under special circumstances. We apologize for any inconvenience.   Follow-Up: At [facility], you and your health needs are our priority.  As part of our continuing mission to provide you with exceptional heart care, our providers are all part of one team.  This team includes your primary Cardiologist (physician) and Advanced Practice Providers or APPs (Physician Assistants and Nurse Practitioners) who all work together to provide you with the care you need, when you need it.  Your next appointment:   3 month(s)  Provider:    Dr. Carson Clara or Marlyse Single, PA-C  We recommend signing up for the patient portal called "MyChart".  Sign up information is  provided on this After Visit Summary.  MyChart is used to connect with patients for Virtual Visits (Telemedicine).  Patients are able to view lab/test results, encounter notes, upcoming appointments, etc.  Non-urgent messages can be sent to your provider as well.   To learn more about what you can do with MyChart, go to  [email or web address].   Other Instructions

## 2024-01-10 NOTE — Progress Notes (Signed)
 Cardiology Office Note:    Date:  01/10/2024  ID:  Megan Pace, DOB 30-Dec-1975, MRN [id number] PCP: Perley Bradley, MD  [facility] Providers Cardiologist:  Wendie Hamburg, MD       Patient Profile:      Hypertension Palpitations Right bundle branch block TTE 01/23/2021: EF 55-60, no RWMA, GLS -20.5, normal RVSF, trivial MR Monitor 12/2020: Sinus rhythm, rare PVCs and PACs no significant arrhythmia Prediabetes  OSA ABIs 12/31/2020: Normal            Discussed the use of AI scribe software for clinical note transcription with the patient, who gave verbal consent to proceed.  History of Present Illness Megan Pace is a 48 y.o. female who returns for follow up of hypertension and to evaluate chest pain. She was last seen by Dr. Alda Amas 12/25/2020 for symptoms of palpitations, shortness of breath and leg pain.  Echocardiogram was obtained and demonstrated normal EF.  Monitor was obtained and demonstrated no significant arrhythmia.  ABIs were obtained and these were normal.  She discontinued her blood pressure medication approximately six to nine months ago but resumed it two months ago after noticing consistently high blood pressure readings. Since resuming medication, her blood pressure has been in the range of 120 to 130 mmHg. She experiences chest discomfort described as a 'tear feeling' or pressure on the left side, which is not painful but noticeable. This sensation occurs randomly, lasting about 30 seconds, and has increased in frequency over the past two months, occurring three to four times a week. She also experiences occasional heart palpitations and a 'rush feeling'. She reports swelling in her feet.  She noticed it more during a recent mission trip to [place]. She experiences shortness of breath during physical activities such as walking, which she attributes to being overweight. She also notes slight shortness of breath when lying flat without her  CPAP machine. Her family history includes cardiovascular issues; her mother had bypass surgery, and both parents have a history of hypertension and diabetes. Her mother and grandmother had kidney issues.    ROS-See HPI       Studies Reviewed:   EKG Interpretation Date/Time:  Tuesday Jan 10 2024 14:05:29 EDT Ventricular Rate:  92 PR Interval:  144 QRS Duration:  86 QT Interval:  364 QTC Calculation: 450 R Axis:   -13  Text Interpretation: Normal sinus rhythm Low voltage QRS Nonspecific ST abnormality No significant change since last tracing Confirmed by Marlyse Single [phone]) on 01/10/2024 2:27:28 PM     Risk Assessment/Calculations:     HYPERTENSION CONTROL Vitals:   01/10/24 1401 01/10/24 1455  BP: (!) 142/80 (!) 140/90    The patient's blood pressure is elevated above target today.  In order to address the patient's elevated BP: A new medication was prescribed today.       Physical Exam:   VS:  BP (!) 140/90   Pulse 92   Ht 5\' 2"  (1.575 m)   Wt 194 lb 3.2 oz (88.1 kg)   SpO2 99%   BMI 35.52 kg/m    Wt Readings from Last 3 Encounters:  01/10/24 194 lb 3.2 oz (88.1 kg)  03/30/23 194 lb (88 kg)  12/15/21 188 lb (85.3 kg)    Constitutional:      Appearance: Healthy appearance. Not in distress.  Neck:     Vascular: JVD normal.  Pulmonary:     Breath sounds: Normal breath sounds. No wheezing. No rales.  Cardiovascular:  Normal rate. Regular rhythm.     Murmurs: There is no murmur.  Edema:    Peripheral edema present.    Pretibial: bilateral trace edema of the pretibial area.    Ankle: bilateral trace edema of the ankle. Abdominal:     Palpations: Abdomen is soft.        Assessment and Plan:   Assessment & Plan Essential hypertension Blood pressures have improved since resuming metoprolol  succinate. Blood pressure still above target.  Goal blood pressure <130/80.   - Start hydrochlorothiazide 12.5 mg daily. - BMET  - Recheck BMET 1-2 weeks - Continue  metoprolol  succinate 50 mg daily Palpitations Controlled with beta-blocker therapy. Shortness of breath She has chest discomfort and shortness of breath.  Her chest discomfort is not necessarily related to exertion.  She does describe a left-sided pressure and has noted some discomfort of her neck at times.  She notes dyspnea on exertion with moderate activity. EKG does not demonstrate any acute changes.  Her heart rate tends to run in the 90s.  I think we would have difficulty getting her heart rate controlled for coronary CTA.  We discussed proceeding with nuclear stress testing versus stress echocardiogram.  She prefers to reduce radiation exposure.  She has noted some lower extremity edema but her neck veins are flat and her lungs are clear.  Her last echocardiogram was in 2022. - BMET, BNP - 2D echocardiogram - Stress echocardiogram - Follow-up 3 months   Informed Consent   Shared Decision Making/Informed Consent The risks [chest pain, shortness of breath, cardiac arrhythmias, dizziness, blood pressure fluctuations, myocardial infarction, stroke/transient ischemic attack, and life-threatening complications (estimated to be 1 in 10,000)], benefits (risk stratification, diagnosing coronary artery disease, treatment guidance) and alternatives of a stress or dobutamine stress echocardiogram were discussed in detail with Megan Pace and she agrees to proceed.     Dispo:  Return in about 3 months (around 04/11/2024) for Routine Follow Up w/ Dr. Alda Amas, or Marlyse Single, PA-C.  Signed, Marlyse Single, PA-C

## 2024-01-11 ENCOUNTER — Ambulatory Visit: Payer: Self-pay | Admitting: Physician Assistant

## 2024-01-11 DIAGNOSIS — I517 Cardiomegaly: Secondary | ICD-10-CM

## 2024-01-11 LAB — BASIC METABOLIC PANEL WITH GFR
BUN/Creatinine Ratio: 16 (ref 9–23)
BUN: 13 mg/dL (ref 6–24)
CO2: 23 mmol/L (ref 20–29)
Calcium: 9.8 mg/dL (ref 8.7–10.2)
Chloride: 99 mmol/L (ref 96–106)
Creatinine, Ser: 0.82 mg/dL (ref 0.57–1.00)
Glucose: 117 mg/dL — ABNORMAL HIGH (ref 70–99)
Potassium: 4.3 mmol/L (ref 3.5–5.2)
Sodium: 140 mmol/L (ref 134–144)
eGFR: 87 mL/min/{1.73_m2} (ref >59)

## 2024-01-11 LAB — PRO B NATRIURETIC PEPTIDE: NT-Pro BNP: <36 pg/mL (ref 0–249)

## 2024-01-12 NOTE — Telephone Encounter (Signed)
 Please contact pharmacy and see if prescription needs to be sent again. Marlyse Single, PA-C    01/12/2024 11:49 AM

## 2024-01-26 ENCOUNTER — Other Ambulatory Visit (HOSPITAL_COMMUNITY)

## 2024-02-16 ENCOUNTER — Telehealth (HOSPITAL_COMMUNITY): Payer: Self-pay | Admitting: *Deleted

## 2024-02-16 NOTE — Telephone Encounter (Signed)
 Left detailed instructions for stress echo on vm.

## 2024-02-17 ENCOUNTER — Other Ambulatory Visit (HOSPITAL_COMMUNITY)

## 2024-02-17 ENCOUNTER — Ambulatory Visit (HOSPITAL_COMMUNITY)
Admission: RE | Admit: 2024-02-17 | Discharge: 2024-02-17 | Disposition: A | Discharge status: Home / Self Care | Source: Ambulatory Visit | Attending: Cardiology | Admitting: Cardiology

## 2024-02-17 DIAGNOSIS — R072 Precordial pain: Secondary | ICD-10-CM | POA: Diagnosis not present

## 2024-02-17 DIAGNOSIS — R0602 Shortness of breath: Secondary | ICD-10-CM

## 2024-02-17 LAB — ECHOCARDIOGRAM COMPLETE
Area-P 1/2: 4.46 cm2
S' Lateral: 2.6 cm

## 2024-02-20 ENCOUNTER — Encounter: Payer: Self-pay | Admitting: Physician Assistant

## 2024-02-20 DIAGNOSIS — I517 Cardiomegaly: Secondary | ICD-10-CM | POA: Insufficient documentation

## 2024-02-23 ENCOUNTER — Ambulatory Visit (HOSPITAL_COMMUNITY)
Admission: RE | Admit: 2024-02-23 | Discharge: 2024-02-23 | Disposition: A | Discharge status: Home / Self Care | Source: Ambulatory Visit | Attending: Cardiology | Admitting: Cardiology

## 2024-02-23 DIAGNOSIS — R072 Precordial pain: Secondary | ICD-10-CM | POA: Diagnosis not present

## 2024-02-23 DIAGNOSIS — R0602 Shortness of breath: Secondary | ICD-10-CM | POA: Insufficient documentation

## 2024-02-23 MED ORDER — PERFLUTREN LIPID MICROSPHERE
1.0000 mL | INTRAVENOUS | Status: DC | PRN
  Administered 2024-02-23: 3 mL via INTRAVENOUS

## 2024-04-17 NOTE — Progress Notes (Deleted)
 Cardiology Office Note:    Date:  04/17/2024   ID:  Megan Pace, Megan Pace [birth year], MRN [id number]  PCP:  Cleotilde Planas, MD  Cardiologist:  Lonni LITTIE Nanas, MD  Electrophysiologist:  None   Referring MD: Cleotilde Planas, MD   No chief complaint on file.   History of Present Illness:    Megan Pace is a 48 y.o. female with a hx of hypertension, OSA on CPAP, T2DM who presents for follow-up.  She was referred by Dr. Cleotilde for evaluation of hypertension and palpitations, initially seen 12/2020.    Echocardiogram 01/2021 showed normal biventricular function, no significant valvular disease.  Zio patch x 13 days 12/2020 showed no significant arrhythmias.  Echocardiogram 01/2024 showed normal biventricular function, no significant valvular disease stress echocardiogram 02/2024 showed no evidence of ischemia.  Since last clinic visit,  Past Medical History:  Diagnosis Date   ADD (attention deficit disorder with hyperactivity)    Anxiety    Depression    Diabetes mellitus    GESTATIONAL   Essential hypertension 01/24/2016   HSV-2 (herpes simplex virus 2) infection    Hypertension    Kidney stones    LVH (left ventricular hypertrophy) 02/20/2024   TTE 02/17/2024: EF 60-65, no RWMA, mild LVH, GR 1 DD, normal RVSF, trivial MR    NSVD (normal spontaneous vaginal delivery)    Obesity (BMI 35.0-39.9 without comorbidity) 01/24/2016   Palpitations    RBBB (right bundle branch block)    w/ leftward axis & borderline LAFB   SOB (shortness of breath)    Systolic murmur    Echo w/EF =>55% year 2008   Tachycardia     Past Surgical History:  Procedure Laterality Date   CERVICAL BIOPSY  W/ LOOP ELECTRODE EXCISION  01/2001   MARGINS FREE CIN 2   LAPAROSCOPIC CHOLECYSTECTOMY     MOUTH SURGERY      Current Medications: No outpatient medications have been marked as taking for the 04/20/24 encounter (Appointment) with Nanas Lonni LITTIE, MD.     Allergies:   Patient  has no known allergies.   Social History   Socioeconomic History   Marital status: Divorced    Spouse name: Not on file   Number of children: Not on file   Years of education: Not on file   Highest education level: Not on file  Occupational History   Not on file  Tobacco Use   Smoking status: Never   Smokeless tobacco: Never  Vaping Use   Vaping status: Never Used  Substance and Sexual Activity   Alcohol use: No    Alcohol/week: 0.0 standard drinks of alcohol   Drug use: No   Sexual activity: Not Currently    Birth control/protection: I.U.D.    Comment: Mirena  inserted 7-16/21  Other Topics Concern   Not on file  Social History Narrative   Not on file   Social Drivers of Health   Financial Resource Strain: Not on file  Food Insecurity: Not on file  Transportation Needs: Not on file  Physical Activity: Not on file  Stress: Not on file  Social Connections: Not on file     Family History: The patient's family history includes Cancer in her mother; Diabetes in her maternal grandfather and mother; Heart disease in her father; Hyperlipidemia in her father, maternal grandfather, and mother; Hypertension in her father and paternal grandmother; Vascular Disease in her mother. There is no history of Breast cancer.  ROS:   Please see the history  of present illness.    (+) Palpitations (+) Edema (+) Flushed/Fullness of chest and face (+) Shortness of breath (+) Leg pain All other systems reviewed and are negative.  EKGs/Labs/Other Studies Reviewed:    The following studies were reviewed today:   EKG:  12/25/20: Sinus rhythm. Rate 98 bpm. Nonspecific T wave flattening.  Recent Labs: 01/10/2024: BUN 13; Creatinine, Ser 0.82; NT-Pro BNP <36; Potassium 4.3; Sodium 140  Recent Lipid Panel    Component Value Date/Time   CHOL 176 10/11/2016 0854   TRIG 275 (H) 10/11/2016 0854   HDL 31 (L) 10/11/2016 0854   CHOLHDL 5.7 (H) 10/11/2016 0854   VLDL 55 (H) 10/11/2016 0854    LDLCALC 90 10/11/2016 0854    Physical Exam:    VS:  There were no vitals taken for this visit.    Wt Readings from Last 3 Encounters:  01/10/24 194 lb 3.2 oz (88.1 kg)  03/30/23 194 lb (88 kg)  12/15/21 188 lb (85.3 kg)     GEN: Well nourished, well developed in no acute distress HEENT: Normal NECK: No JVD; No carotid bruits LYMPHATICS: No lymphadenopathy CARDIAC: RRR, no murmurs, rubs, gallops RESPIRATORY:  Clear to auscultation without rales, wheezing or rhonchi  ABDOMEN: Soft, non-tender, non-distended MUSCULOSKELETAL:  No edema; No deformity  SKIN: Warm and dry NEUROLOGIC:  Alert and oriented x 3 PSYCHIATRIC:  Normal affect   ASSESSMENT:    No diagnosis found.  PLAN:    Palpitations: Suspect PVCs.  Zio patch x 13 days 12/2020 showed no significant arrhythmias.  Echocardiogram 01/2024 showed normal biventricular function, no significant valvular disease stress echocardiogram 02/2024 showed no evidence of ischemia. -Continue toprol  XL  Dyspnea.  Echocardiogram 01/2024 showed normal biventricular function, no significant valvular disease stress echocardiogram 02/2024 showed no evidence of ischemia.  Leg pain: Normal ABIs 12/2020  Hypertension: on Toprol -XL 50 mg daily, HCTZ 12.5 mg daily.  Check BMET  RTC in 3 months***  Medication Adjustments/Labs and Tests Ordered: Current medicines are reviewed at length with the patient today.  Concerns regarding medicines are outlined above.  No orders of the defined types were placed in this encounter.  No orders of the defined types were placed in this encounter.   There are no Patient Instructions on file for this visit.     Signed, Lonni LITTIE Nanas, MD  04/17/2024 5:09 PM    [facility]

## 2024-04-20 ENCOUNTER — Ambulatory Visit: Admitting: Cardiology

## 2024-09-11 ENCOUNTER — Encounter: Payer: Self-pay | Admitting: Obstetrics and Gynecology

## 2024-09-11 ENCOUNTER — Ambulatory Visit: Admitting: Obstetrics and Gynecology

## 2024-09-11 VITALS — BP 116/82 | HR 106

## 2024-09-11 DIAGNOSIS — N939 Abnormal uterine and vaginal bleeding, unspecified: Secondary | ICD-10-CM | POA: Diagnosis not present

## 2024-09-11 MED ORDER — MEDROXYPROGESTERONE ACETATE 10 MG PO TABS: 10.0000 mg | ORAL_TABLET | Freq: Every day | ORAL | 0 refills | Status: AC

## 2024-09-11 NOTE — Progress Notes (Signed)
 "  GYNECOLOGY  VISIT   HPI: 49 y.o.   Divorced  Caucasian female   [phone] with Patient's last menstrual period was 09/09/2024 (exact date).   here for: Abnormal bleeding, very heavy, having to change her pads around every 1.5 hours. Having cramps, almost went the 12 months without period but has started again.      No dizziness or lightheadedness.  Mirena  removed 03/30/23 due to depression symptoms.   Had a period 1 month later.  Then had bleeding 11 months later.  Had one or two months of spotting.   12/15/21 - FSH 15.4 and estradiol  87.   Her PCP provides her Wellbutrin for her.  Has a counselor also.     Not sexually active for many years.    Has some left lower back pain with Mounjaro injections.    Traveling to [place] in 1 month.  GYNECOLOGIC HISTORY: Patient's last menstrual period was 09/09/2024 (exact date). Contraception:  None  Menopausal hormone therapy:  n/a Last 2 paps:  12/10/20 neg, HR HPV neg, 02/07/15 neg  History of abnormal Pap or positive HPV:  no Mammogram:  03/05/20 Breast Density Cat C, BIRADS Cat 1 neg         OB History     Gravida  4   Para  2   Term  2   Preterm      AB  1   Living  2      SAB  1   IAB      Ectopic      Multiple      Live Births  2              Patient Active Problem List   Diagnosis Date Noted   LVH (left ventricular hypertrophy) 02/20/2024   PVC (premature ventricular contraction) 01/24/2016   Essential hypertension 01/24/2016   Obesity (BMI 35.0-39.9 without comorbidity) 01/24/2016   IUD (intrauterine device) in place 03/11/2015   History of cervical dysplasia 02/07/2015   Urinary, incontinence, stress female 07/03/2013   Hx gestational diabetes 10/06/2011   Family history of diabetes mellitus 10/06/2011    Past Medical History:  Diagnosis Date   ADD (attention deficit disorder with hyperactivity)    Anxiety    Depression    Diabetes mellitus    GESTATIONAL   Essential hypertension  01/24/2016   HSV-2 (herpes simplex virus 2) infection    Hypertension    Kidney stones    LVH (left ventricular hypertrophy) 02/20/2024   TTE 02/17/2024: EF 60-65, no RWMA, mild LVH, GR 1 DD, normal RVSF, trivial MR    NSVD (normal spontaneous vaginal delivery)    Obesity (BMI 35.0-39.9 without comorbidity) 01/24/2016   Palpitations    RBBB (right bundle branch block)    w/ leftward axis & borderline LAFB   SOB (shortness of breath)    Systolic murmur    Echo w/EF =>55% year 2008   Tachycardia     Past Surgical History:  Procedure Laterality Date   CERVICAL BIOPSY  W/ LOOP ELECTRODE EXCISION  01/2001   MARGINS FREE CIN 2   LAPAROSCOPIC CHOLECYSTECTOMY     MOUTH SURGERY      Current Outpatient Medications  Medication Sig Dispense Refill   ALPRAZolam  (XANAX ) 0.25 MG tablet Take 1 tablet (0.25 mg total) by mouth at bedtime as needed for anxiety. 30 tablet 0   aspirin EC 81 MG tablet 1 tablet Orally occasionally     buPROPion (WELLBUTRIN XL) 150 MG  24 hr tablet Take 150 mg by mouth every morning.     buPROPion (WELLBUTRIN XL) 300 MG 24 hr tablet Take 1 tablet by mouth daily.     meloxicam (MOBIC) 15 MG tablet 1 tablet Orally Once a day; Duration: 30 days As needed     metoprolol  succinate (TOPROL -XL) 50 MG 24 hr tablet TAKE 1 TABLET(50 MG) BY MOUTH DAILY WITH OR IMMEDIATELY FOLLOWING A MEAL 90 tablet 3   Multiple Vitamin (MULTIVITAMIN) tablet Take 1 tablet by mouth daily. When she remembers.     NEXIUM 40 MG capsule 1 capsule.     tirzepatide (MOUNJARO) 2.5 MG/0.5ML Pen as directed Subcutaneous weekly; Duration: 28 days     valACYclovir  (VALTREX ) 500 MG tablet Take 1 tablet (500 mg total) by mouth 2 (two) times daily. Take for 3-5 days at first sign of outbreak 180 tablet 0   VITAMIN D PO Take 1 tablet by mouth daily.     atorvastatin (LIPITOR) 10 MG tablet 1 tablet Orally Once a day; Duration: 90 days (Patient not taking: Reported on 09/11/2024)     hydrochlorothiazide  (MICROZIDE )  12.5 MG capsule Take 1 capsule (12.5 mg total) by mouth daily. (Patient not taking: Reported on 09/11/2024) 90 capsule 3   metFORMIN (GLUCOPHAGE-XR) 500 MG 24 hr tablet SMARTSIG:1 Tablet(s) By Mouth Every Evening (Patient not taking: Reported on 09/11/2024)     No current facility-administered medications for this visit.     ALLERGIES: Patient has no known allergies.  Family History  Problem Relation Age of Onset   Diabetes Mother        SKIN    Cancer Mother        SKIN   Hyperlipidemia Mother    Vascular Disease Mother    Hypertension Father    Hyperlipidemia Father    Heart disease Father    Hypertension Paternal Grandmother    Diabetes Maternal Grandfather    Hyperlipidemia Maternal Grandfather    Breast cancer Neg Hx     Social History   Socioeconomic History   Marital status: Divorced    Spouse name: Not on file   Number of children: Not on file   Years of education: Not on file   Highest education level: Not on file  Occupational History   Not on file  Tobacco Use   Smoking status: Never   Smokeless tobacco: Never  Vaping Use   Vaping status: Never Used  Substance and Sexual Activity   Alcohol use: No    Alcohol/week: 0.0 standard drinks of alcohol   Drug use: No   Sexual activity: Not Currently    Birth control/protection: None  Other Topics Concern   Not on file  Social History Narrative   Not on file   Social Drivers of Health   Tobacco Use: Low Risk (09/11/2024)   Patient History    Smoking Tobacco Use: Never    Smokeless Tobacco Use: Never    Passive Exposure: Not on file  Financial Resource Strain: Not on file  Food Insecurity: Not on file  Transportation Needs: Not on file  Physical Activity: Not on file  Stress: Not on file  Social Connections: Not on file  Intimate Partner Violence: Not on file  Depression (EYV7-0): Not on file  Alcohol Screen: Not on file  Housing: Not on file  Utilities: Not on file  Health Literacy: Not on file     Review of Systems  All other systems reviewed and are negative.   PHYSICAL EXAMINATION:  BP 116/82 (BP Location: Left Arm, Patient Position: Sitting)   Pulse (!) 106   LMP 09/09/2024 (Exact Date)   SpO2 98%     General appearance: alert, cooperative and appears stated age   Pelvic: External genitalia:  no lesions.  Blood stained vulva.               Urethra:  normal appearing urethra with no masses, tenderness or lesions              Bartholins and Skenes: normal                 Vagina: normal appearing vagina with normal color and discharge, no lesions              Cervix: no lesions.  Clot from cervix.                   Bimanual Exam:  Uterus:  normal size, contour, position, consistency, mobility, non-tender              Adnexa: no mass, fullness, tenderness            Chaperone was present for exam:  Kari HERO, CMA  ASSESSMENT:  Abnormal uterine bleeding.  Hx depression.    PLAN:  CBC, FSH, estradiol . Provera  10 mg by mouth daily for 10 days.   Potential side effects reviewed.  She will stop Provera  if she finds herself becoming depressed.   Return for pelvic US  for further evaluation.  If needs ongoing treatment for bleeding would consider nonhormonal options.    30 min  total time was spent for this patient encounter, including preparation, face-to-face counseling with the patient, coordination of care, and documentation of the encounter.    "

## 2024-09-12 LAB — CBC
HCT: 44 % (ref 35.9–46.0)
Hemoglobin: 14.7 g/dL (ref 11.7–15.5)
MCH: 28.7 pg (ref 27.0–33.0)
MCHC: 33.4 g/dL (ref 31.6–35.4)
MCV: 85.9 fL (ref 81.4–101.7)
MPV: 9.9 fL (ref 7.5–12.5)
Platelets: 435 Thousand/uL — ABNORMAL HIGH (ref 140–400)
RBC: 5.12 Million/uL — ABNORMAL HIGH (ref 3.80–5.10)
RDW: 12.9 % (ref 11.0–15.0)
WBC: 8.1 Thousand/uL (ref 3.8–10.8)

## 2024-09-12 LAB — FOLLICLE STIMULATING HORMONE: FSH: 15.1 m[IU]/mL

## 2024-09-13 ENCOUNTER — Ambulatory Visit: Payer: Self-pay | Admitting: Obstetrics and Gynecology

## 2024-09-13 DIAGNOSIS — R7989 Other specified abnormal findings of blood chemistry: Secondary | ICD-10-CM

## 2024-09-17 LAB — ESTRADIOL, ULTRA SENS: Estradiol, Ultra Sensitive: 22 pg/mL

## 2024-10-10 ENCOUNTER — Other Ambulatory Visit

## 2024-10-10 ENCOUNTER — Other Ambulatory Visit: Admitting: Obstetrics and Gynecology

## 2024-11-09 ENCOUNTER — Ambulatory Visit: Admitting: Nurse Practitioner
